# Patient Record
Sex: Male | Born: 1950 | Hispanic: No | Marital: Married | Smoking: Never smoker
Health system: Southern US, Community
[De-identification: ages and names within clinical notes are randomized; demographics above are authoritative.]

## PROBLEM LIST (undated history)

## (undated) DIAGNOSIS — E119 Type 2 diabetes mellitus without complications: Secondary | ICD-10-CM

## (undated) DIAGNOSIS — S83209A Unspecified tear of unspecified meniscus, current injury, unspecified knee, initial encounter: Secondary | ICD-10-CM

## (undated) DIAGNOSIS — E785 Hyperlipidemia, unspecified: Secondary | ICD-10-CM

## (undated) DIAGNOSIS — H269 Unspecified cataract: Secondary | ICD-10-CM

## (undated) DIAGNOSIS — I10 Essential (primary) hypertension: Secondary | ICD-10-CM

## (undated) HISTORY — DX: Essential (primary) hypertension: I10

## (undated) HISTORY — DX: Hyperlipidemia, unspecified: E78.5

## (undated) HISTORY — PX: MOUTH SURGERY: SHX715

## (undated) HISTORY — PX: CATARACT EXTRACTION, BILATERAL: SHX1313

## (undated) HISTORY — PX: COLONOSCOPY W/ POLYPECTOMY: SHX1380

## (undated) HISTORY — DX: Type 2 diabetes mellitus without complications: E11.9

---

## 2003-02-24 ENCOUNTER — Encounter: Payer: Self-pay | Admitting: Internal Medicine

## 2005-01-12 ENCOUNTER — Ambulatory Visit: Payer: Self-pay | Admitting: Internal Medicine

## 2006-05-22 ENCOUNTER — Ambulatory Visit: Payer: Self-pay | Admitting: Internal Medicine

## 2006-05-30 ENCOUNTER — Ambulatory Visit: Payer: Self-pay | Admitting: Internal Medicine

## 2007-09-26 ENCOUNTER — Ambulatory Visit: Payer: Self-pay | Admitting: Internal Medicine

## 2007-09-26 DIAGNOSIS — E785 Hyperlipidemia, unspecified: Secondary | ICD-10-CM

## 2007-09-26 DIAGNOSIS — E782 Mixed hyperlipidemia: Secondary | ICD-10-CM | POA: Insufficient documentation

## 2007-09-26 LAB — CONVERTED CEMR LAB: Cholesterol, target level: 200 mg/dL

## 2008-02-27 ENCOUNTER — Ambulatory Visit: Payer: Self-pay | Admitting: Internal Medicine

## 2008-02-27 DIAGNOSIS — M545 Low back pain: Secondary | ICD-10-CM

## 2008-02-27 DIAGNOSIS — N400 Enlarged prostate without lower urinary tract symptoms: Secondary | ICD-10-CM

## 2008-02-27 DIAGNOSIS — I10 Essential (primary) hypertension: Secondary | ICD-10-CM | POA: Insufficient documentation

## 2008-02-27 DIAGNOSIS — Z86010 Personal history of colonic polyps: Secondary | ICD-10-CM

## 2008-02-28 ENCOUNTER — Encounter: Payer: Self-pay | Admitting: Internal Medicine

## 2008-03-03 ENCOUNTER — Ambulatory Visit: Payer: Self-pay | Admitting: Internal Medicine

## 2008-03-07 LAB — CONVERTED CEMR LAB
ALT: 25 units/L (ref 0–53)
Alkaline Phosphatase: 63 units/L (ref 39–117)
Basophils Absolute: 0 10*3/uL (ref 0.0–0.1)
Bilirubin, Direct: 0.1 mg/dL (ref 0.0–0.3)
CO2: 30 meq/L (ref 19–32)
Calcium: 9.7 mg/dL (ref 8.4–10.5)
Chloride: 106 meq/L (ref 96–112)
Cholesterol: 172 mg/dL (ref 0–200)
HDL: 37.3 mg/dL — ABNORMAL LOW (ref >39.0)
Hgb A1c MFr Bld: 6.6 % — ABNORMAL HIGH (ref 4.6–6.0)
LDL Cholesterol: 120 mg/dL — ABNORMAL HIGH (ref 0–99)
Lymphocytes Relative: 23 % (ref 12.0–46.0)
MCHC: 34.1 g/dL (ref 30.0–36.0)
Microalb, Ur: 2.2 mg/dL — ABNORMAL HIGH (ref 0.0–1.9)
Monocytes Relative: 14.6 % — ABNORMAL HIGH (ref 3.0–12.0)
Neutro Abs: 3.5 10*3/uL (ref 1.4–7.7)
Neutrophils Relative %: 60 % (ref 43.0–77.0)
Platelets: 158 10*3/uL (ref 150–400)
Potassium: 4.2 meq/L (ref 3.5–5.1)
RDW: 12.4 % (ref 11.5–14.6)
Saturation Ratios: 26.7 % (ref 20.0–50.0)
Sodium: 140 meq/L (ref 135–145)
TSH: 1.24 microintl units/mL (ref 0.35–5.50)
Total Bilirubin: 1.3 mg/dL — ABNORMAL HIGH (ref 0.3–1.2)
Total CHOL/HDL Ratio: 4.6
Transferrin: 225 mg/dL (ref >=212.0)
Triglycerides: 73 mg/dL (ref 0–149)
VLDL: 15 mg/dL (ref 0–40)

## 2008-03-09 ENCOUNTER — Encounter (INDEPENDENT_AMBULATORY_CARE_PROVIDER_SITE_OTHER): Payer: Self-pay | Admitting: *Deleted

## 2008-03-09 ENCOUNTER — Telehealth (INDEPENDENT_AMBULATORY_CARE_PROVIDER_SITE_OTHER): Payer: Self-pay | Admitting: *Deleted

## 2008-03-10 ENCOUNTER — Ambulatory Visit: Payer: Self-pay | Admitting: Internal Medicine

## 2008-03-12 ENCOUNTER — Encounter: Payer: Self-pay | Admitting: Internal Medicine

## 2008-03-12 ENCOUNTER — Ambulatory Visit: Payer: Self-pay

## 2008-03-26 ENCOUNTER — Telehealth (INDEPENDENT_AMBULATORY_CARE_PROVIDER_SITE_OTHER): Payer: Self-pay | Admitting: *Deleted

## 2008-04-16 ENCOUNTER — Telehealth (INDEPENDENT_AMBULATORY_CARE_PROVIDER_SITE_OTHER): Payer: Self-pay | Admitting: *Deleted

## 2008-04-26 ENCOUNTER — Telehealth (INDEPENDENT_AMBULATORY_CARE_PROVIDER_SITE_OTHER): Payer: Self-pay | Admitting: *Deleted

## 2008-06-08 ENCOUNTER — Ambulatory Visit: Payer: Self-pay | Admitting: Internal Medicine

## 2008-06-12 LAB — CONVERTED CEMR LAB
ALT: 17 units/L (ref 0–53)
AST: 22 units/L (ref 0–37)
Bilirubin, Direct: 0.1 mg/dL (ref 0.0–0.3)
Cholesterol: 165 mg/dL (ref 0–200)
Creatinine,U: 227.9 mg/dL
LDL Cholesterol: 113 mg/dL — ABNORMAL HIGH (ref 0–99)
Microalb, Ur: 0.7 mg/dL (ref 0.0–1.9)
Total Bilirubin: 1.4 mg/dL — ABNORMAL HIGH (ref 0.3–1.2)
Total CHOL/HDL Ratio: 5

## 2008-06-15 ENCOUNTER — Ambulatory Visit: Payer: Self-pay | Admitting: Internal Medicine

## 2008-06-15 ENCOUNTER — Encounter (INDEPENDENT_AMBULATORY_CARE_PROVIDER_SITE_OTHER): Payer: Self-pay | Admitting: *Deleted

## 2008-06-15 ENCOUNTER — Telehealth (INDEPENDENT_AMBULATORY_CARE_PROVIDER_SITE_OTHER): Payer: Self-pay | Admitting: *Deleted

## 2008-09-23 ENCOUNTER — Ambulatory Visit: Payer: Self-pay | Admitting: Internal Medicine

## 2008-09-23 DIAGNOSIS — R7309 Other abnormal glucose: Secondary | ICD-10-CM

## 2008-09-25 LAB — CONVERTED CEMR LAB
ALT: 17 units/L (ref 0–53)
AST: 23 units/L (ref 0–37)
Albumin: 4.3 g/dL (ref 3.5–5.2)
Alkaline Phosphatase: 64 units/L (ref 39–117)
Hgb A1c MFr Bld: 5.8 % (ref 4.6–6.5)
Total Bilirubin: 1.1 mg/dL (ref 0.3–1.2)
Total CHOL/HDL Ratio: 4
Total Protein: 7.3 g/dL (ref 6.0–8.3)

## 2009-07-25 ENCOUNTER — Telehealth (INDEPENDENT_AMBULATORY_CARE_PROVIDER_SITE_OTHER): Payer: Self-pay | Admitting: *Deleted

## 2009-09-08 ENCOUNTER — Ambulatory Visit: Payer: Self-pay | Admitting: Internal Medicine

## 2009-09-08 DIAGNOSIS — R209 Unspecified disturbances of skin sensation: Secondary | ICD-10-CM | POA: Insufficient documentation

## 2009-09-08 DIAGNOSIS — N402 Nodular prostate without lower urinary tract symptoms: Secondary | ICD-10-CM

## 2009-12-01 ENCOUNTER — Telehealth (INDEPENDENT_AMBULATORY_CARE_PROVIDER_SITE_OTHER): Payer: Self-pay | Admitting: *Deleted

## 2009-12-02 ENCOUNTER — Ambulatory Visit: Payer: Self-pay | Admitting: Internal Medicine

## 2009-12-02 DIAGNOSIS — S20219A Contusion of unspecified front wall of thorax, initial encounter: Secondary | ICD-10-CM

## 2009-12-20 ENCOUNTER — Ambulatory Visit: Payer: Self-pay | Admitting: Internal Medicine

## 2009-12-20 DIAGNOSIS — M546 Pain in thoracic spine: Secondary | ICD-10-CM | POA: Insufficient documentation

## 2009-12-20 DIAGNOSIS — K6289 Other specified diseases of anus and rectum: Secondary | ICD-10-CM | POA: Insufficient documentation

## 2009-12-21 ENCOUNTER — Telehealth (INDEPENDENT_AMBULATORY_CARE_PROVIDER_SITE_OTHER): Payer: Self-pay | Admitting: *Deleted

## 2009-12-21 ENCOUNTER — Encounter: Payer: Self-pay | Admitting: Internal Medicine

## 2009-12-22 LAB — CONVERTED CEMR LAB
Basophils Absolute: 0 10*3/uL (ref 0.0–0.1)
Eosinophils Absolute: 0.2 10*3/uL (ref 0.0–0.7)
Lymphocytes Relative: 27.3 % (ref 12.0–46.0)
MCHC: 33.6 g/dL (ref 30.0–36.0)
MCV: 94.5 fL (ref 78.0–100.0)
Monocytes Absolute: 0.8 10*3/uL (ref 0.1–1.0)
Neutrophils Relative %: 51.8 % (ref 43.0–77.0)
Platelets: 152 10*3/uL (ref 150.0–400.0)

## 2010-03-12 LAB — CONVERTED CEMR LAB
ALT: 27 units/L (ref 0–53)
AST: 31 units/L (ref 0–37)
Albumin: 4.5 g/dL (ref 3.5–5.2)
BUN: 15 mg/dL (ref 6–23)
Basophils Absolute: 0 10*3/uL (ref 0.0–0.1)
Chloride: 97 meq/L (ref 96–112)
Cholesterol: 150 mg/dL (ref 0–200)
Eosinophils Relative: 1.9 % (ref 0.0–5.0)
GFR calc non Af Amer: 72.86 mL/min (ref >60)
Glucose, Bld: 83 mg/dL (ref 70–99)
HCT: 44.5 % (ref 39.0–52.0)
Hemoglobin: 15.3 g/dL (ref 13.0–17.0)
LDL Goal: 120 mg/dL
Lymphs Abs: 1.3 10*3/uL (ref 0.7–4.0)
MCV: 92.4 fL (ref 78.0–100.0)
Monocytes Absolute: 0.6 10*3/uL (ref 0.1–1.0)
Monocytes Relative: 11.9 % (ref 3.0–12.0)
Neutro Abs: 2.7 10*3/uL (ref 1.4–7.7)
PSA: 1.01 ng/mL (ref 0.10–4.00)
Platelets: 141 10*3/uL — ABNORMAL LOW (ref 150.0–400.0)
Potassium: 4.2 meq/L (ref 3.5–5.1)
RDW: 13.6 % (ref 11.5–14.6)
Sodium: 139 meq/L (ref 135–145)
TSH: 1.11 microintl units/mL (ref 0.35–5.50)
Total Bilirubin: 1.1 mg/dL (ref 0.3–1.2)

## 2010-03-14 NOTE — Consult Note (Signed)
Summary: [facility]  [facility]   Imported By: Lanelle Bal 01/03/2010 09:25:13  _____________________________________________________________________  External Attachment:    Type:   Image     Comment:   External Document
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
## 2010-03-14 NOTE — Progress Notes (Signed)
Summary: Refill Request  Phone Note Refill Request Call back at [phone] Message from:  Pharmacy on July 25, 2009 2:08 PM  Refills Requested: Medication #1:  BENAZEPRIL HCL 40 MG  TABS (BENAZEPRIL HCL) 1 once daily*OFFICE VISIT DUE NOW *   Dosage confirmed as above?Dosage Confirmed   Supply Requested: 3 months [facility] [address] [place], [place] [address]   Method Requested:   Next Appointment Scheduled: none Initial call taken by: Harold Barban,  July 25, 2009 2:10 PM  Follow-up for Phone Call        RX faxed to: [phone] Follow-up by: Shonna Chock,  July 25, 2009 3:21 PM    New/Updated Medications: BENAZEPRIL HCL 40 MG TABS (BENAZEPRIL HCL) 1 by mouth once daily Prescriptions: BENAZEPRIL HCL 40 MG TABS (BENAZEPRIL HCL) 1 by mouth once daily  #90 x 0   Entered by:   Shonna Chock   Authorized by:   Marga Melnick MD   Signed by:   Shonna Chock on 07/25/2009   Method used:   Print then Give to Patient   RxID:   [id number]

## 2010-03-14 NOTE — Assessment & Plan Note (Signed)
Summary: cpx/fasting//kn   Vital Signs:  Patient profile:   60 year old male Height:      68.75 inches Weight:      203.2 pounds BMI:     30.34 Temp:     98.3 degrees F oral Pulse rate:   72 / minute Resp:     14 per minute BP sitting:   122 / 80  (left arm) Cuff size:   large  Vitals Entered By: Shonna Chock CMA (September 08, 2009 10:54 AM)    History of Present Illness: Mr. Blanchette is here for a physical; he describes numbness & tingling in LUE  from shoulder to fingers with exercise intermittently. Hyperlipidemia Follow-Up      This is a 60 year old man who presents for Hyperlipidemia follow-up.  The patient denies muscle aches, GI upset, abdominal pain, flushing, itching, constipation, diarrhea, and fatigue.  The patient denies the following symptoms: chest pain/pressure, exercise intolerance, dypsnea, palpitations, syncope, and pedal edema.  Compliance with medications (by patient report) has been near 100%.  Dietary compliance has been fair.  The patient reports exercising daily.   Hypertension Follow-Up      The patient also presents for Hypertension follow-up.  The patient denies lightheadedness, urinary frequency, and headaches.  Compliance with medications (by patient report) has been near 100%.  BP @home  140/93 on average.  Lipid Management History:      Positive NCEP/ATP III risk factors include male age 60 years old or older, HDL cholesterol less than 40, family history for ischemic heart disease (females less than 68 years old), and hypertension.  Negative NCEP/ATP III risk factors include non-diabetic, non-tobacco-user status, no ASHD (atherosclerotic heart disease), no prior stroke/TIA, no peripheral vascular disease, and no history of aortic aneurysm.     Allergies (verified): No Known Drug Allergies  Past History:  Past Medical History: Hyperglycemia, fasting (glucose 108 in 2006) Hyperlipidemia: NMR 2005: LDL 188(2219/1166), HDL 33, TG 116. LDL goal = < 120.  [place] Study LDL goal = < 130. Hypertension Colonic polyps, hx of  Past Surgical History: Colonoscopy: no polyps 2003;  PMH of polyps 1996 in [place]  Family History: Father: colon cancer, lung cancer, prostatecancer Mother: HTN, DM,dialysis, CAD with MI in 41s, CVA Siblings:bro :? colon cancer, HTN; bro : d lung  cancer ; M uncle :colon cancer; P uncles: lung  cancer  Social History: Occupation: Scientist, clinical (histocompatibility and immunogenetics) Married Never Smoked Alcohol use-no Regular exercise-yes: walking 2 mpd . Note : he is moving to [place] ,Wynelle Link 09/11/2009.  Review of Systems General:  Denies chills, fever, sleep disorder, and sweats. Eyes:  Denies blurring, double vision, and vision loss-both eyes. ENT:  Denies difficulty swallowing and hoarseness. Resp:  Denies cough and sputum productive. GI:  Denies bloody stools, change in bowel habits, dark tarry stools, and indigestion. GU:  Denies discharge, dysuria, and hematuria. MS:  Denies joint pain, joint redness, joint swelling, low back pain, mid back pain, and thoracic pain. Derm:  Denies changes in nail beds, dryness, hair loss, and lesion(s). Neuro:  Denies disturbances in coordination, numbness, and weakness. Psych:  Denies anxiety and depression. Endo:  Denies cold intolerance, excessive hunger, excessive thirst, excessive urination, and heat intolerance. Heme:  Denies abnormal bruising and bleeding. Allergy:  Complains of itching eyes, seasonal allergies, and sneezing; OTC meds as needed . No angioedema..  Physical Exam  General:  well-nourished; alert,appropriate and cooperative throughout examination Head:  Normocephalic and atraumatic without obvious abnormalities. No apparent alopecia  Eyes:  No corneal or conjunctival inflammation noted. Perrla. Funduscopic exam benign, without hemorrhages, exudates or papilledema. Ears:  External ear exam shows no significant lesions or deformities.  Otoscopic examination reveals clear canals,  tympanic membranes are intact bilaterally without bulging, retraction, inflammation or discharge. Hearing is grossly normal bilaterally. Nose:  External nasal examination shows no deformity or inflammation. Nasal mucosa are pink and moist without lesions or exudates. Mouth:  Oral mucosa and oropharynx without lesions or exudates.  Teeth in good repair. Neck:  No deformities, masses, or tenderness noted. Full ROM Lungs:  Normal respiratory effort, chest expands symmetrically. Lungs are clear to auscultation, no crackles or wheezes. Heart:  Normal rate and regular rhythm. S1 and S2 normal without gallop, murmur, click, rub. Abdomen:  Bowel sounds positive,abdomen soft and non-tender without masses, organomegaly or hernias noted. Rectal:  No external abnormalities noted. Normal sphincter tone. No rectal masses or tenderness. Genitalia:  Testes bilaterally descended without nodularity, tenderness or masses. No scrotal masses or lesions. No penis lesions or urethral discharge. Prostate:  Prostate small and smooth, ? small soft nodule L inferior lobe. No , tenderness, mass, asymmetry or induration. Msk:  No deformity or scoliosis noted of thoracic or lumbar spine.   Pulses:  R and L carotid,radial and posterior tibial pulses are full and equal bilaterally. Decreased DPP w/o ischemic change Extremities:  No clubbing, cyanosis, edema, or deformity noted with normal full range of motion of all joints.   Neurologic:  alert & oriented X3, strength normal in all extremities, and gait normal.   Skin:  Intact without suspicious lesions or rashes Cervical Nodes:  No lymphadenopathy noted Axillary Nodes:  No palpable lymphadenopathy Inguinal Nodes:  No significant adenopathy Psych:  memory intact for recent and remote, normally interactive, and good eye contact.     Impression & Recommendations:  Problem # 1:  ROUTINE GENERAL MEDICAL EXAM@HEALTH  CARE FACL (ICD-V70.0)  Orders: EKG w/ Interpretation  (93000) Venipuncture (41324) TLB-Lipid Panel (80061-LIPID) TLB-BMP (Basic Metabolic Panel-BMET) (80048-METABOL) TLB-CBC Platelet - w/Differential (85025-CBCD) TLB-Hepatic/Liver Function Pnl (80076-HEPATIC) TLB-TSH (Thyroid Stimulating Hormone) (84443-TSH) TLB-PSA (Prostate Specific Antigen) (84153-PSA) TLB-A1C / Hgb A1C (Glycohemoglobin) (83036-A1C) Specimen Handling (40102)  Problem # 2:  PARESTHESIA (ICD-782.0)  Intermittent exertional N&T LUE; NOT positional   Orders: Venipuncture (72536)  Problem # 3:  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 control; home BP cuff needs to be evaluated His updated medication list for this problem includes:    Benazepril Hcl 40 Mg Tabs (Benazepril hcl) .Marland Kitchen... 1 by mouth once daily    Amlodipine Besylate 5 Mg Tabs (Amlodipine besylate) .Marland Kitchen... Add 1 once daily to benazepril 40 mg once daily if bp averages > 135/85  Orders: Venipuncture (64403)  Problem # 5:  HYPERLIPIDEMIA (ICD-272.4)  Orders: Venipuncture (47425)  Problem # 6:  FASTING HYPERGLYCEMIA (ICD-790.29)  Orders: TLB-A1C / Hgb A1C (Glycohemoglobin) (83036-A1C)  Complete Medication List: 1)  Benazepril Hcl 40 Mg Tabs (Benazepril hcl) .Marland Kitchen.. 1 by mouth once daily 2)  Pravastatin Sodium 40 Mg Tabs (pravastatin Sodium)  .Marland Kitchen.. 1 at bedtime 3)  Onetouch Ultrasoft Lancets Misc (Lancets) .... M,w,f,sun check fasting bloodsugar. t,th,sat 2 hours after breakfast or dinner 4)  Onetouch Test Strp (Glucose blood) .... As directed 5)  Amlodipine Besylate 5 Mg Tabs (Amlodipine besylate) .... Add 1 once daily to benazepril 40 mg once daily if bp averages > 135/85  Other Orders: Tdap => 62yrs IM (16109) Admin 1st Vaccine (60454)  Lipid Assessment/Plan:      Based on NCEP/ATP III, the patient's risk factor category is "0-1 risk  factors".  The patient's lipid goals are as follows: Total cholesterol goal is 200; LDL cholesterol goal is 120; HDL cholesterol goal is 40; Triglyceride goal is 150.  His LDL cholesterol goal has been met.    Patient Instructions: 1)  Curtail exercise until you can schedule a routine stress test.Check your BP cuff @ drugstore & take it to all MD appts. Possible prostate nodule should be rechecked in 8 weeks. PSA ordered. Prescriptions: AMLODIPINE BESYLATE 5 MG TABS (AMLODIPINE BESYLATE) add 1 once daily to Benazepril 40 mg once daily if BP AVERAGES > 135/85  #30 x 5   Entered and Authorized by:   Marga Melnick MD   Signed by:   Marga Melnick MD on 09/08/2009   Method used:   Print then Give to Patient   RxID:   [id number] PRAVASTATIN SODIUM 40 MG TABS (PRAVASTATIN SODIUM) 1 at bedtime  #90 x 3   Entered and Authorized by:   Marga Melnick MD   Signed by:   Marga Melnick MD on 09/08/2009   Method used:   Print then Give to Patient   RxID:   [id number] BENAZEPRIL HCL 40 MG TABS (BENAZEPRIL HCL) 1 by mouth once daily  #90 x 3   Entered and Authorized by:   Marga Melnick MD   Signed by:   Marga Melnick MD on 09/08/2009   Method used:   Print then Give to Patient   RxID:   [id number]    Immunizations Administered:  Tetanus Vaccine:    Vaccine Type: Tdap    Site: right deltoid    Mfr: GlaxoSmithKline    Dose: 0.5 ml    Route: IM    Given by: Shonna Chock CMA    Exp. Date: 05/07/2011    Lot #: [id number]    VIS given: 12/31/06 version given September 08, 2009.
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
## 2010-03-14 NOTE — Progress Notes (Signed)
  Phone Note Other Incoming   Request: Send information Summary of Call: Patient called and requested a disc for his appt with Dr. Ethelene Hal today. Disc is ready for pick up at Medical records reception.

## 2010-06-30 NOTE — Assessment & Plan Note (Signed)
Bryce Hurst                        Bryce Hurst NOTE   NAME:Hurst, Bryce COUTTS                    MRN:          [id number]  DATE:05/22/2006                            DOB:          1950-02-20    Bryce Hurst was seen May 22, 2006 at age 60 for a comprehensive physical  exam.   His only active symptom was pain at the right elbow which was classic  for tenosynovitis.   PAST HISTORY:  Includes a colonoscopy in 1996 which revealed polyps in  the colon.  Repeat colonoscopy in 2003 was negative.  He also has had  bone implant surgery.   Review of the record indicates that he has had mild dyslipidemia; his  LDL has been as high as 188 in 2005.  Additionally he has had mild  hyperglycemia; glucose was 108 in 2006.  At that time  his LDL was 118.  He previously has been a vegetarian but is not at present.   He swims at least 3 times per week and does cardio-exercises with no  cardiopulmonary symptoms.   FAMILY HISTORY:  Included cancer of the lung and colon in his father,  who was a smoker.  His father also had coronary artery disease.  Paternal aunt had breast cancer, paternal uncle had lung cancer and was  a smoker.  Another paternal uncle had prostate cancer.  His mother had  diabetes, hypertension, 4 heart attacks with onset in her 56s and  stroke.  A brother had hypertension and a brother died of lung cancer.   Bryce Hurst has never smoked and does not drink.   He is on no medications at present and has no known drug allergies.   As stated the remainder of the review of systems was negative.   He is 5 feet 8 inches and weighs 206 pounds fully clothed, which is a  gain of approximately 6 pounds.  Pulse was 76 and regular.  Respiratory  rate 14.  Blood pressure 120/84.  He did have evidence of mild conjunctivitis in the right eye with  erythema but no purulence , chemosis, or abnormality of EOM or vision.  On fundal exam there was  slight arteriolar narrowing.  Some slight  increased cerumen in noted canals.  The cardiopulmonary exam was unremarkable.  The dorsalis pedis pulses were slightly decreased, but there was no  evidence of ischemia peripherally.  He had no organomegaly, lymphadenopathy.  MUSCULOSKELETAL:  Revealed classic tenosynovitis of the right elbow.  Prostate was slightly irregular without definite nodularity.  Hemoccult  testing was negative.   EKG was normal; there was poor R wave progression across the precordium.   In reviewing the NMR LipoProfile, his LDL goal should be less than 115.  Because of his mild hyperglycemia an A1C would be indicated.  Carbohydrate restriction such as the cookbook The Flat Belly Diet  would be recommended.  Low weight, stretching  exercises for the tendons  @ the elbow were recommended, followed by the application of moist heart  or Zostrix cream.  Erythromycin ointment twice a day for 5 days was  recommended  for the mild conjunctivitis.  He did not have cervical  lymphadenopathy and there was no evidence of frank purulence.  Warning  signs & symptoms of decreased extraocular motion, ocular pain, high  fevers or purulent discharge were to be reported.   Further recommendations are pending the completion of fasting labs.     Bryce Hurst. Bryce Ren, MD,FACP,FCCP  Electronically Signed    [facility]  DD: 05/30/2006  DT: 05/30/2006  Job #: [id number]   cc:   Bryce Hurst

## 2011-02-19 ENCOUNTER — Telehealth: Payer: Self-pay | Admitting: Internal Medicine

## 2011-02-19 MED ORDER — BENAZEPRIL HCL 40 MG PO TABS: 40.0000 mg | ORAL_TABLET | Freq: Every day | ORAL | Status: DC

## 2011-02-19 NOTE — Telephone Encounter (Signed)
Patient called pharmacy to refill blood pressure med(patient unsure of name) and was told prescription had expired and to call our office. Patient stated that he is out of medication and checked his blood pressure this morning. 159/104. [facility] on [address]

## 2011-02-19 NOTE — Telephone Encounter (Signed)
RX sent to pharmacy for a 90 day supply.   Spoke with patient, Patient schedule for f/u on Thursday, patient was offered to be seen sooner and refused

## 2011-02-22 ENCOUNTER — Ambulatory Visit (INDEPENDENT_AMBULATORY_CARE_PROVIDER_SITE_OTHER): Payer: PRIVATE HEALTH INSURANCE | Admitting: Internal Medicine

## 2011-02-22 ENCOUNTER — Encounter: Payer: Self-pay | Admitting: Internal Medicine

## 2011-02-22 DIAGNOSIS — J019 Acute sinusitis, unspecified: Secondary | ICD-10-CM

## 2011-02-22 DIAGNOSIS — I10 Essential (primary) hypertension: Secondary | ICD-10-CM

## 2011-02-22 DIAGNOSIS — J209 Acute bronchitis, unspecified: Secondary | ICD-10-CM

## 2011-02-22 MED ORDER — AMOXICILLIN 500 MG PO CAPS: 500.0000 mg | ORAL_CAPSULE | Freq: Three times a day (TID) | ORAL | Status: AC

## 2011-02-22 NOTE — Assessment & Plan Note (Signed)
Blood pressure is well-controlled since she's been back on his medications. There was inadvertent disruption in his therapy.  He has a cough which is essentially dry for the most part. If this persists after treating the bronchitis and rhinosinusitis, the ACE inhibitor should be changed to an ARB.

## 2011-02-22 NOTE — Progress Notes (Signed)
  Subjective:    Patient ID: Bryce Hurst, male    DOB: August 18, 1950, 61 y.o.   MRN: [id number]  HPI  #1 HYPERTENSION: Disease Monitoring  Blood pressure range: up to 159/104 over holidays. He had run out of his medication and felt that his increased stress from all family coming home for the holidays. Additionally he was taking NyQuil for respiratory symptoms. The acute  blood pressure elevation was associated with some headache over one  week  Chest pain: no   Dyspnea: no   Claudication: no   Medication compliance: he had misplaced his medications; when he tried to refill them they were expired.  Medication Side Effects  Lightheadedness: no   Urinary frequency: no   Edema: no     Preventitive Healthcare:  Exercise: yes, swimming   Diet Pattern: no plan  Salt Restriction:no    #2 Respiratory tract infection Onset/symptoms:12/26 as body aches; he has not had the flu shot Exposures (illness/environmental/extrinsic):no Progression of symptoms:to chills & head congestion & cough Treatments/response:Nyquil Present symptoms: Fever/chills/sweats:no Frontal headache:no Facial pain:no Nasal purulence:yellow Sore throat:yes Dental pain:no Lymphadenopathy:no Wheezing/shortness of breath:no Cough/sputum/hemoptysis:dry cough; intermittent scant  yellow Associated extrinsic/allergic symptoms:itchy eyes/ sneezing:no Past medical history: Seasonal allergies: yes/asthma:no Smoking history:never             Review of Systems     Objective:   Physical Exam  General appearance:good health ;well nourished; no acute distress or increased work of breathing is present.  No  lymphadenopathy about the head, neck, or axilla noted.   Eyes: No conjunctival inflammation or lid edema is present.   Ears:  External ear exam shows no significant lesions or deformities.  Otoscopic examination reveals clear canals, tympanic membranes are intact bilaterally without bulging, retraction,  inflammation or discharge.  Nose:  External nasal examination shows no deformity or inflammation. Nasal mucosa are pink and moist without lesions or exudates. No septal dislocation or deviation.No obstruction to airflow.   Oral exam: Dental hygiene is good; lips and gums are healthy appearing.There is no oropharyngeal erythema or exudate noted.      Heart:  Normal rate and regular rhythm. S1 and S2 normal without gallop, murmur, click, rub or other extra sounds.   Lungs:Chest clear to auscultation; no wheezes, rhonchi,rales ,or rubs present.No increased work of breathing.    Extremities:  No cyanosis, edema, or clubbing  noted    All pulses intact without  bruits .No ischemic skin changes.     Skin: Warm & dry w/o jaundice or tenting.           Assessment & Plan:  #1 rhinosinusitis with purulent secretions  #2 bronchitis, acute. These are in the context of probable post viral influenza syndrome.  Plan: See orders and recommendations.

## 2011-02-22 NOTE — Patient Instructions (Signed)
Plain Mucinex for thick secretions ;force NON dairy fluids. Use a Neti pot daily as needed for sinus congestion  Blood Pressure Goal  Ideally is an AVERAGE < 135/85. This AVERAGE should be calculated from @ least 5-7 BP readings taken @ different times of day on different days of week. You should not respond to isolated BP readings , but rather the AVERAGE for that week

## 2011-04-03 ENCOUNTER — Encounter: Payer: Self-pay | Admitting: Internal Medicine

## 2011-04-03 ENCOUNTER — Ambulatory Visit (INDEPENDENT_AMBULATORY_CARE_PROVIDER_SITE_OTHER): Payer: PRIVATE HEALTH INSURANCE | Admitting: Internal Medicine

## 2011-04-03 VITALS — BP 136/80 | HR 85 | Temp 97.8°F | Resp 14 | Ht 69.0 in | Wt 209.0 lb

## 2011-04-03 DIAGNOSIS — N400 Enlarged prostate without lower urinary tract symptoms: Secondary | ICD-10-CM

## 2011-04-03 DIAGNOSIS — E785 Hyperlipidemia, unspecified: Secondary | ICD-10-CM

## 2011-04-03 DIAGNOSIS — Z0000 Encounter for general adult medical examination without abnormal findings: Secondary | ICD-10-CM

## 2011-04-03 LAB — CBC WITH DIFFERENTIAL/PLATELET
Basophils Relative: 0.5 % (ref 0.0–3.0)
Eosinophils Absolute: 0.6 10*3/uL (ref 0.0–0.7)
Eosinophils Relative: 9.9 % — ABNORMAL HIGH (ref 0.0–5.0)
Lymphocytes Relative: 21.5 % (ref 12.0–46.0)
MCV: 92.7 fl (ref 78.0–100.0)
Monocytes Absolute: 0.8 10*3/uL (ref 0.1–1.0)
Neutrophils Relative %: 56.1 % (ref 43.0–77.0)
Platelets: 147 10*3/uL — ABNORMAL LOW (ref 150.0–400.0)
RBC: 5.07 Mil/uL (ref 4.22–5.81)
WBC: 6.4 10*3/uL (ref 4.5–10.5)

## 2011-04-03 LAB — BASIC METABOLIC PANEL
CO2: 28 mEq/L (ref 19–32)
Calcium: 9.8 mg/dL (ref 8.4–10.5)
Creatinine, Ser: 1 mg/dL (ref 0.4–1.5)
GFR: 82.8 mL/min (ref >60.00)
Glucose, Bld: 121 mg/dL — ABNORMAL HIGH (ref 70–99)
Sodium: 139 mEq/L (ref 135–145)

## 2011-04-03 LAB — HEPATIC FUNCTION PANEL
ALT: 24 U/L (ref 0–53)
Alkaline Phosphatase: 71 U/L (ref 39–117)
Bilirubin, Direct: 0.1 mg/dL (ref 0.0–0.3)
Total Bilirubin: 0.7 mg/dL (ref 0.3–1.2)
Total Protein: 7.1 g/dL (ref 6.0–8.3)

## 2011-04-03 LAB — TSH: TSH: 1.1 u[IU]/mL (ref 0.35–5.50)

## 2011-04-03 LAB — LIPID PANEL
HDL: 43 mg/dL (ref >39.00)
Total CHOL/HDL Ratio: 5
Triglycerides: 273 mg/dL — ABNORMAL HIGH (ref 0.0–149.0)

## 2011-04-03 LAB — PSA: PSA: 0.81 ng/mL (ref 0.10–4.00)

## 2011-04-03 NOTE — Patient Instructions (Signed)
Preventive Health Care: Exercise at least 30-45 minutes a day,  3-4 days a week.  Eat a low-fat diet with lots of fruits and vegetables, up to 7-9 servings per day. Consume less than 40 grams of sugar per day from foods & drinks with High Fructose Corn Sugar as # 1,2,3 or # 4 on label. Health Care Power of Attorney & Living Will. Complete if not in place ; these place you in charge of your health care decisions. Blood Pressure Goal  Ideally is an AVERAGE < 135/85. This AVERAGE should be calculated from @ least 5-7 BP readings taken @ different times of day on different days of week. You should not respond to isolated BP readings , but rather the AVERAGE for that week  

## 2011-04-03 NOTE — Assessment & Plan Note (Signed)
As noted there is a soft lobular change of the left inferior lobe of the prostate without nodularity or induration.

## 2011-04-03 NOTE — Progress Notes (Signed)
  Subjective:    Patient ID: Bryce Hurst, male    DOB: 1950-10-24, 61 y.o.   MRN: [id number]  HPI  Bryce Hurst is here for a physical;he denies acute issues.     Review of Systems HYPERTENSION: Disease Monitoring: Blood pressure range-not checked  Chest pain, palpitations- no       Dyspnea- no Medications: Compliance- yes  Lightheadedness,Syncope- no   Edema- no  FASTING HYPERGLYCEMIA; PMH of: Polyuria/phagia/dipsia- no      Visual problems- no  HYPERLIPIDEMIA: Disease Monitoring: See symptoms for Hypertension Medications: Compliance- formerly on statin ; unsure why it was stopped   Abd pain, bowel changes-no   Muscle aches- no        Objective:   Physical Exam Gen.: Healthy and well-nourished in appearance. Alert, appropriate and cooperative throughout exam. Head: Normocephalic without obvious abnormalities;  no alopecia  Eyes: No corneal or conjunctival inflammation noted. Pupils equal round reactive to light and accommodation. Fundal exam is benign without hemorrhages, exudate, papilledema. Extraocular motion intact. Vision grossly normal with lenses.Cataract OD Ears: External  ear exam reveals no significant lesions or deformities. Canals clear .TMs normal. Hearing is grossly normal bilaterally. Nose: External nasal exam reveals no deformity or inflammation. Nasal mucosa are pink and moist. No lesions or exudates noted.  Mouth: Oral mucosa and oropharynx reveal no lesions or exudates. Teeth in good repair. Neck: No deformities, masses, or tenderness noted. Range of motion & Thyroid normal (physiologic asymmtery ) Lungs: Normal respiratory effort; chest expands symmetrically. Lungs are clear to auscultation without rales, wheezes, or increased work of breathing. Heart: Normal rate and rhythm. Normal S1 and S2. No gallop, click, or rub. S4 w/o  murmur. Abdomen: Bowel sounds normal; abdomen soft and nontender. No masses, organomegaly or hernias noted. Genitalia/DRE:  Genital exam  was unremarkable. Prostate is not enlarged but there is mild asymmetry. There is a soft lodular change of the left inferior lobe. There is no induration                                          Musculoskeletal/extremities: No deformity or scoliosis noted of  the thoracic or lumbar spine. No clubbing, cyanosis, edema, or deformity noted. Range of motion  normal .Tone & strength  normal.Joints normal. Nail health  good. Vascular: Carotid, radial artery, dorsalis pedis and  posterior tibial pulses are full and equal. No bruits present. Neurologic: Alert and oriented x3. Deep tendon reflexes symmetrical and normal.          Skin: Intact without suspicious lesions or rashes. Lipoma in the epigastric area Lymph: No cervical, axillary, or inguinal lymphadenopathy present. Psych: Mood and affect are normal. Normally interactive                                                                                         Assessment & Plan:  #1 comprehensive physical exam; no acute findings #2 see Problem List with Assessments & Recommendations Plan: see Orders

## 2011-05-21 ENCOUNTER — Other Ambulatory Visit: Payer: Self-pay | Admitting: *Deleted

## 2011-05-21 NOTE — Telephone Encounter (Signed)
Pt left VM that he needed the following med refill but did not leave pharmacy. Left Pt detail message to return call to confirm pharmacy.

## 2011-05-22 MED ORDER — BENAZEPRIL HCL 40 MG PO TABS: 40.0000 mg | ORAL_TABLET | Freq: Every day | ORAL | Status: DC

## 2011-05-22 NOTE — Telephone Encounter (Signed)
Pt return call stating to send Rx to [facility]. Rx sent left Pt detail message.

## 2011-09-05 ENCOUNTER — Other Ambulatory Visit: Payer: Self-pay | Admitting: Internal Medicine

## 2011-11-09 ENCOUNTER — Telehealth: Payer: Self-pay | Admitting: Internal Medicine

## 2011-11-09 NOTE — Telephone Encounter (Signed)
ProctoFoam HC apply twice to 3 times a day following sitz baths

## 2011-11-09 NOTE — Telephone Encounter (Signed)
Pt states he has an acute hemorrhoid problem and would like Dr. Alwyn Ren to call him in something to [facility] on [address]. He states Dr. Alwyn Ren has treated this in the past. Please call pt at work [phone]

## 2011-11-09 NOTE — Telephone Encounter (Signed)
Last OV 03/2011 (CPX), Dr.Hopper please advise

## 2011-11-12 ENCOUNTER — Telehealth: Payer: Self-pay

## 2011-11-12 MED ORDER — HYDROCORTISONE ACE-PRAMOXINE 1-1 % RE FOAM: 1.0000 | Freq: Two times a day (BID) | RECTAL | Status: DC

## 2011-11-12 MED ORDER — HYDROCORTISONE ACE-PRAMOXINE 1-1 % RE FOAM: 1.0000 | Freq: Three times a day (TID) | RECTAL | Status: DC

## 2011-11-12 NOTE — Telephone Encounter (Signed)
Rx sent.    MW 

## 2011-11-12 NOTE — Telephone Encounter (Signed)
Pt called in concerning hemroid Rx. Advised him Rx sent to [facility] and gave pt number to call and check on Rx. Pt stated understanding.    MW

## 2011-11-12 NOTE — Telephone Encounter (Signed)
RX sent, (Message was routed to me today)

## 2011-12-06 ENCOUNTER — Other Ambulatory Visit: Payer: Self-pay

## 2011-12-06 NOTE — Telephone Encounter (Signed)
I do not know of any stronger medication; he should be seen by gastroenterologist if this is not working. This can be refilled until a GI referral can be made. Let me know if he has a preference of present

## 2011-12-06 NOTE — Telephone Encounter (Signed)
Pt left message on triage line stated needed hemroid Rx. I called pt back pt stated is there something stronger than this foam you Rx bleeding when going to bathroom. Plz advise        MW

## 2011-12-07 MED ORDER — HYDROCORTISONE ACE-PRAMOXINE 1-1 % RE FOAM: 1.0000 | Freq: Three times a day (TID) | RECTAL | Status: DC

## 2011-12-07 NOTE — Telephone Encounter (Signed)
Pt called back advised pt Rx sent and GI would need to be seen for stronger Rx. Pt stated he will use the med I sent in if not better he will call back for GI referral.       MW

## 2011-12-07 NOTE — Telephone Encounter (Signed)
Called left pt a message want pt to call. Rx sent and need to advise if med not working need to see gastr per [employer].   MW

## 2012-03-18 ENCOUNTER — Other Ambulatory Visit: Payer: Self-pay | Admitting: Orthopedic Surgery

## 2012-03-19 ENCOUNTER — Other Ambulatory Visit: Payer: Self-pay | Admitting: Internal Medicine

## 2012-03-19 NOTE — Telephone Encounter (Signed)
Patient needs to schedule a CPX  

## 2012-03-29 ENCOUNTER — Other Ambulatory Visit: Payer: Self-pay

## 2012-04-07 ENCOUNTER — Ambulatory Visit: Payer: BC Managed Care – PPO

## 2012-04-07 ENCOUNTER — Encounter: Payer: Self-pay | Admitting: Internal Medicine

## 2012-04-07 ENCOUNTER — Ambulatory Visit (INDEPENDENT_AMBULATORY_CARE_PROVIDER_SITE_OTHER): Payer: BC Managed Care – PPO | Admitting: Internal Medicine

## 2012-04-07 VITALS — BP 124/80 | HR 87 | Temp 98.0°F | Resp 14 | Ht 69.5 in | Wt 210.0 lb

## 2012-04-07 DIAGNOSIS — R7309 Other abnormal glucose: Secondary | ICD-10-CM

## 2012-04-07 DIAGNOSIS — Z0000 Encounter for general adult medical examination without abnormal findings: Secondary | ICD-10-CM

## 2012-04-07 DIAGNOSIS — Z86010 Personal history of colonic polyps: Secondary | ICD-10-CM

## 2012-04-07 DIAGNOSIS — E785 Hyperlipidemia, unspecified: Secondary | ICD-10-CM

## 2012-04-07 DIAGNOSIS — I10 Essential (primary) hypertension: Secondary | ICD-10-CM

## 2012-04-07 LAB — LIPID PANEL
HDL: 41 mg/dL (ref >39.00)
LDL Cholesterol: 128 mg/dL — ABNORMAL HIGH (ref 0–99)
Total CHOL/HDL Ratio: 5
Triglycerides: 114 mg/dL (ref 0.0–149.0)

## 2012-04-07 LAB — BASIC METABOLIC PANEL
Calcium: 9.6 mg/dL (ref 8.4–10.5)
GFR: 67.93 mL/min (ref >60.00)
Glucose, Bld: 124 mg/dL — ABNORMAL HIGH (ref 70–99)
Sodium: 140 mEq/L (ref 135–145)

## 2012-04-07 LAB — CBC WITH DIFFERENTIAL/PLATELET
Eosinophils Relative: 3.7 % (ref 0.0–5.0)
Lymphocytes Relative: 24.8 % (ref 12.0–46.0)
MCV: 89.9 fl (ref 78.0–100.0)
Monocytes Absolute: 0.6 10*3/uL (ref 0.1–1.0)
Monocytes Relative: 11.2 % (ref 3.0–12.0)
Neutrophils Relative %: 59.9 % (ref 43.0–77.0)
Platelets: 144 10*3/uL — ABNORMAL LOW (ref 150.0–400.0)
RBC: 5.07 Mil/uL (ref 4.22–5.81)
WBC: 4.9 10*3/uL (ref 4.5–10.5)

## 2012-04-07 LAB — HEPATIC FUNCTION PANEL
ALT: 18 U/L (ref 0–53)
Alkaline Phosphatase: 65 U/L (ref 39–117)
Bilirubin, Direct: 0.1 mg/dL (ref 0.0–0.3)
Total Bilirubin: 0.9 mg/dL (ref 0.3–1.2)

## 2012-04-07 LAB — HEMOGLOBIN A1C: Hgb A1c MFr Bld: 6.8 % — ABNORMAL HIGH (ref 4.6–6.5)

## 2012-04-07 LAB — TSH: TSH: 1.57 u[IU]/mL (ref 0.35–5.50)

## 2012-04-07 NOTE — Progress Notes (Signed)
  Subjective:    Patient ID: Bryce Hurst, male    DOB: [birth year], 62 y.o.   MRN: [id number]  HPI  Bryce Hurst is here for a physical; he denies acute issues but he has pending cataract & arthroscopic knee surgery pending.    Review of Systems  HYPERTENSION follow-up: Home blood pressure range 120-140/85-93. Patient is compliant with medications. No adverse effects noted from medication. Eercise program as swimming 1  time per week for  15-30 minutes. No specific dietary program .  No chest pain, palpitations, dyspnea, claudication,edema or paroxysmal nocturnal dyspnea described. No significant lightheadedness, headache, epistaxis, or syncope.          Objective:   Physical Exam Gen.: Healthy and well-nourished in appearance. Alert, appropriate and cooperative throughout exam.Appears younger than stated age  Head: Normocephalic without obvious abnormalities.  Eyes: No corneal or conjunctival inflammation noted. Pupils equal round reactive to light and accommodation.  Extraocular motion intact. Vision grossly normal with lenses Ears: External  ear exam reveals no significant lesions or deformities. Canals clear .TMs normal. Hearing is grossly normal bilaterally. Nose: External nasal exam reveals no deformity or inflammation. Nasal mucosa are pink and moist. No lesions or exudates noted.   Mouth: Oral mucosa and oropharynx reveal no lesions or exudates. Teeth in good repair. Neck: No deformities, masses, or tenderness noted. Range of motion & Thyroid normal. Lungs: Normal respiratory effort; chest expands symmetrically. Lungs are clear to auscultation without rales, wheezes, or increased work of breathing. Heart: Normal rate and rhythm. Normal S1 and S2. No gallop, click, or rub. S4 w/o murmur. Abdomen: Bowel sounds normal; abdomen soft and nontender. No masses, organomegaly or hernias noted. Genitalia: Genitalia normal except for left varices. Prostate is not enlarged but the left  lobe is slightly irregular with  smooth nodular character inferiorly.                                  Musculoskeletal/extremities: No deformity or scoliosis noted of  the thoracic or lumbar spine.  No clubbing, cyanosis, edema, or significant extremity  deformity noted. Range of motion normal .Tone & strength  Normal. Joints normal . Nail health good. Able to lie down & sit up w/o help. Negative SLR bilaterally Vascular: Carotid, radial artery, dorsalis pedis and  posterior tibial pulses are full and equal. No bruits present. Neurologic: Alert and oriented x3. Deep tendon reflexes symmetrical and normal.  Gait normal   .        Skin: Intact witht suspicious lesions or rashes. SQ lipomata of abdomen. Lymph: No cervical, axillary, or inguinal lymphadenopathy present. Psych: Mood and affect are normal. Normally interactive                                                                                         Assessment & Plan:  #1 comprehensive physical exam; no acute findings  #2 no medical contraindication to the planned cataract and arthroscopic knee surgeries  #3 slight nodular change of the prostate  Plan: see Orders  & Recommendations

## 2012-04-07 NOTE — Patient Instructions (Addendum)
Preventive Health Care: Exercise at least 30-45 minutes a day,  3-4 days a week.  Eat a low-fat diet with lots of fruits and vegetables, up to 7-9 servings per day. This would eliminate the need for vitamin supplements. Consume less than 40 grams of sugar (preferably ZERO) per day from foods & drinks with High Fructose Corn Sugar as #1,2,3 or # 4 on label. Health Care Power of Attorney & Living Will. Complete these if not in place ; these place you in charge of your health care decisions. As per the Standard of Care , screening Colonoscopy recommended @ 50 & every 5-10 years thereafter . More frequent monitor would be dictated by family history or findings @ Colonoscopy  Minimal Blood Pressure Goal= AVERAGE < 140/90;  Ideal is an AVERAGE < 135/85. This AVERAGE should be calculated from @ least 5-7 BP readings taken @ different times of day on different days of week. You should not respond to isolated BP readings , but rather the AVERAGE for that week .Please bring your  blood pressure cuff to office visits to verify that it is reliable.It  can also be checked against the blood pressure device at the pharmacy. Finger or wrist cuffs are not dependable; an arm cuff is. To prevent palpitations or premature beats, avoid stimulants such as decongestants, diet pills, nicotine, or caffeine (coffee, tea, cola, or chocolate) to excess. Review and correct the record as indicated. Please share record with all medical staff seen.

## 2012-04-07 NOTE — Addendum Note (Signed)
Addended byPecola Lawless on: 04/07/2012 12:28 PM   Modules accepted: Orders

## 2012-04-21 ENCOUNTER — Encounter (HOSPITAL_BASED_OUTPATIENT_CLINIC_OR_DEPARTMENT_OTHER): Payer: Self-pay | Admitting: *Deleted

## 2012-04-21 NOTE — Progress Notes (Signed)
NPO AFTER MN. ARRIVES AT 1030. NEEDS ISTAT. CURRENT EKG IN EPIC AND CHART.

## 2012-04-22 ENCOUNTER — Ambulatory Visit (AMBULATORY_SURGERY_CENTER): Payer: BC Managed Care – PPO | Admitting: *Deleted

## 2012-04-22 VITALS — Ht 69.0 in | Wt 212.0 lb

## 2012-04-22 DIAGNOSIS — Z800 Family history of malignant neoplasm of digestive organs: Secondary | ICD-10-CM

## 2012-04-22 DIAGNOSIS — Z1211 Encounter for screening for malignant neoplasm of colon: Secondary | ICD-10-CM

## 2012-04-22 MED ORDER — PEG-KCL-NACL-NASULF-NA ASC-C 100 G PO SOLR: ORAL | Status: DC

## 2012-04-22 NOTE — Progress Notes (Signed)
No egg or soy allergy 

## 2012-04-23 ENCOUNTER — Encounter: Payer: Self-pay | Admitting: Internal Medicine

## 2012-04-25 ENCOUNTER — Encounter (HOSPITAL_BASED_OUTPATIENT_CLINIC_OR_DEPARTMENT_OTHER): Payer: Self-pay | Admitting: *Deleted

## 2012-04-25 ENCOUNTER — Ambulatory Visit (HOSPITAL_BASED_OUTPATIENT_CLINIC_OR_DEPARTMENT_OTHER)
Admission: RE | Admit: 2012-04-25 | Discharge: 2012-04-25 | Disposition: A | Discharge status: Home / Self Care | Payer: BC Managed Care – PPO | Source: Ambulatory Visit | Attending: Orthopedic Surgery | Admitting: Orthopedic Surgery

## 2012-04-25 ENCOUNTER — Ambulatory Visit (HOSPITAL_BASED_OUTPATIENT_CLINIC_OR_DEPARTMENT_OTHER): Payer: BC Managed Care – PPO | Admitting: Anesthesiology

## 2012-04-25 ENCOUNTER — Encounter (HOSPITAL_BASED_OUTPATIENT_CLINIC_OR_DEPARTMENT_OTHER)
Admission: RE | Disposition: A | Discharge status: Home / Self Care | Payer: Self-pay | Source: Ambulatory Visit | Attending: Orthopedic Surgery

## 2012-04-25 ENCOUNTER — Encounter (HOSPITAL_BASED_OUTPATIENT_CLINIC_OR_DEPARTMENT_OTHER): Payer: Self-pay | Admitting: Anesthesiology

## 2012-04-25 DIAGNOSIS — X58XXXA Exposure to other specified factors, initial encounter: Secondary | ICD-10-CM | POA: Insufficient documentation

## 2012-04-25 DIAGNOSIS — Z79899 Other long term (current) drug therapy: Secondary | ICD-10-CM | POA: Insufficient documentation

## 2012-04-25 DIAGNOSIS — Z9889 Other specified postprocedural states: Secondary | ICD-10-CM

## 2012-04-25 DIAGNOSIS — IMO0002 Reserved for concepts with insufficient information to code with codable children: Secondary | ICD-10-CM | POA: Insufficient documentation

## 2012-04-25 DIAGNOSIS — E785 Hyperlipidemia, unspecified: Secondary | ICD-10-CM | POA: Insufficient documentation

## 2012-04-25 DIAGNOSIS — I10 Essential (primary) hypertension: Secondary | ICD-10-CM | POA: Insufficient documentation

## 2012-04-25 HISTORY — PX: KNEE ARTHROSCOPY WITH MEDIAL MENISECTOMY: SHX5651

## 2012-04-25 HISTORY — DX: Unspecified tear of unspecified meniscus, current injury, unspecified knee, initial encounter: S83.209A

## 2012-04-25 HISTORY — DX: Unspecified cataract: H26.9

## 2012-04-25 LAB — POCT I-STAT 4, (NA,K, GLUC, HGB,HCT)
Glucose, Bld: 118 mg/dL — ABNORMAL HIGH (ref 70–99)
HCT: 44 % (ref 39.0–52.0)
Sodium: 141 mEq/L (ref 135–145)

## 2012-04-25 SURGERY — ARTHROSCOPY, KNEE, WITH MEDIAL MENISCECTOMY
Anesthesia: General | Site: Knee | Laterality: Right | Wound class: Clean

## 2012-04-25 MED ORDER — BUPIVACAINE-EPINEPHRINE 0.5% -1:200000 IJ SOLN
INTRAMUSCULAR | Status: DC | PRN
  Administered 2012-04-25: 20 mL

## 2012-04-25 MED ORDER — EPINEPHRINE HCL 1 MG/ML IJ SOLN
INTRAMUSCULAR | Status: DC | PRN
  Administered 2012-04-25: 2 mL

## 2012-04-25 MED ORDER — HYDROCODONE-ACETAMINOPHEN 7.5-325 MG PO TABS
1.0000 | ORAL_TABLET | Freq: Four times a day (QID) | ORAL | Status: DC | PRN
  Administered 2012-04-25: 1 via ORAL
  Filled 2012-04-25: qty 1

## 2012-04-25 MED ORDER — HYDROCODONE-ACETAMINOPHEN 7.5-325 MG PO TABS: 1.0000 | ORAL_TABLET | Freq: Four times a day (QID) | ORAL | Status: DC | PRN

## 2012-04-25 MED ORDER — ONDANSETRON HCL 4 MG/2ML IJ SOLN
INTRAMUSCULAR | Status: DC | PRN
  Administered 2012-04-25: 4 mg via INTRAVENOUS

## 2012-04-25 MED ORDER — DEXAMETHASONE SODIUM PHOSPHATE 4 MG/ML IJ SOLN
INTRAMUSCULAR | Status: DC | PRN
  Administered 2012-04-25: 10 mg via INTRAVENOUS

## 2012-04-25 MED ORDER — KETOROLAC TROMETHAMINE 30 MG/ML IJ SOLN
INTRAMUSCULAR | Status: DC | PRN
  Administered 2012-04-25: 30 mg via INTRAVENOUS

## 2012-04-25 MED ORDER — POVIDONE-IODINE 7.5 % EX SOLN
Freq: Once | CUTANEOUS | Status: DC
  Filled 2012-04-25: qty 118

## 2012-04-25 MED ORDER — LIDOCAINE HCL (CARDIAC) 20 MG/ML IV SOLN
INTRAVENOUS | Status: DC | PRN
  Administered 2012-04-25: 100 mg via INTRAVENOUS

## 2012-04-25 MED ORDER — MORPHINE SULFATE 4 MG/ML IJ SOLN
INTRAMUSCULAR | Status: DC | PRN
  Administered 2012-04-25: 4 mg via INTRAVENOUS

## 2012-04-25 MED ORDER — LACTATED RINGERS IV SOLN
INTRAVENOUS | Status: DC | PRN
  Administered 2012-04-25 (×2): via INTRAVENOUS

## 2012-04-25 MED ORDER — MIDAZOLAM HCL 5 MG/5ML IJ SOLN
INTRAMUSCULAR | Status: DC | PRN
  Administered 2012-04-25: 2 mg via INTRAVENOUS

## 2012-04-25 MED ORDER — PROPOFOL 10 MG/ML IV BOLUS
INTRAVENOUS | Status: DC | PRN
  Administered 2012-04-25: 250 mg via INTRAVENOUS

## 2012-04-25 MED ORDER — PROMETHAZINE HCL 25 MG/ML IJ SOLN
6.2500 mg | INTRAMUSCULAR | Status: DC | PRN
  Filled 2012-04-25: qty 1

## 2012-04-25 MED ORDER — ALBUTEROL SULFATE (5 MG/ML) 0.5% IN NEBU
2.5000 mg | INHALATION_SOLUTION | Freq: Four times a day (QID) | RESPIRATORY_TRACT | Status: DC | PRN
  Administered 2012-04-25: 2.5 mg via RESPIRATORY_TRACT
  Filled 2012-04-25: qty 0.5

## 2012-04-25 MED ORDER — SODIUM CHLORIDE 0.9 % IR SOLN
Status: DC | PRN
  Administered 2012-04-25: 12000 mL

## 2012-04-25 MED ORDER — FENTANYL CITRATE 0.05 MG/ML IJ SOLN
INTRAMUSCULAR | Status: DC | PRN
  Administered 2012-04-25 (×4): 50 ug via INTRAVENOUS

## 2012-04-25 MED ORDER — FENTANYL CITRATE 0.05 MG/ML IJ SOLN
25.0000 ug | INTRAMUSCULAR | Status: DC | PRN
  Administered 2012-04-25 (×2): 25 ug via INTRAVENOUS
  Filled 2012-04-25: qty 1

## 2012-04-25 MED ORDER — LACTATED RINGERS IV SOLN
INTRAVENOUS | Status: DC
  Administered 2012-04-25 (×2): via INTRAVENOUS
  Filled 2012-04-25: qty 1000

## 2012-04-25 SURGICAL SUPPLY — 51 items
BANDAGE ELASTIC 6 VELCRO ST LF (GAUZE/BANDAGES/DRESSINGS) ×2 IMPLANT
BANDAGE ESMARK 6X9 LF (GAUZE/BANDAGES/DRESSINGS) ×1 IMPLANT
BANDAGE GAUZE ELAST BULKY 4 IN (GAUZE/BANDAGES/DRESSINGS) ×2 IMPLANT
BLADE 4.2CUDA (BLADE) IMPLANT
BLADE CUDA 5.5 (BLADE) IMPLANT
BLADE CUDA SHAVER 3.5 (BLADE) ×2 IMPLANT
BLADE CUTTER GATOR 3.5 (BLADE) IMPLANT
BLADE GREAT WHITE 4.2 (BLADE) IMPLANT
BNDG ESMARK 6X9 LF (GAUZE/BANDAGES/DRESSINGS) ×2
CANISTER SUCT LVC 12 LTR MEDI- (MISCELLANEOUS) ×2 IMPLANT
CANISTER SUCTION 1200CC (MISCELLANEOUS) ×2 IMPLANT
CLOTH BEACON ORANGE TIMEOUT ST (SAFETY) ×2 IMPLANT
DRAPE ARTHROSCOPY W/POUCH 114 (DRAPES) ×2 IMPLANT
DRAPE LG THREE QUARTER DISP (DRAPES) ×2 IMPLANT
DRSG EMULSION OIL 3X3 NADH (GAUZE/BANDAGES/DRESSINGS) ×2 IMPLANT
DRSG PAD ABDOMINAL 8X10 ST (GAUZE/BANDAGES/DRESSINGS) ×2 IMPLANT
DURAPREP 26ML APPLICATOR (WOUND CARE) ×2 IMPLANT
ELECT MENISCUS 165MM 90D (ELECTRODE) IMPLANT
ELECT REM PT RETURN 9FT ADLT (ELECTROSURGICAL)
ELECTRODE REM PT RTRN 9FT ADLT (ELECTROSURGICAL) IMPLANT
GAUZE SPONGE 4X4 12PLY STRL LF (GAUZE/BANDAGES/DRESSINGS) ×2 IMPLANT
GLOVE BIOGEL PI IND STRL 7.5 (GLOVE) ×1 IMPLANT
GLOVE BIOGEL PI IND STRL 8 (GLOVE) ×1 IMPLANT
GLOVE BIOGEL PI INDICATOR 7.5 (GLOVE) ×1
GLOVE BIOGEL PI INDICATOR 8 (GLOVE) ×1
GLOVE ECLIPSE 7.5 STRL STRAW (GLOVE) ×2 IMPLANT
GLOVE ECLIPSE 8.0 STRL XLNG CF (GLOVE) ×4 IMPLANT
GLOVE INDICATOR 8.0 STRL GRN (GLOVE) ×4 IMPLANT
GOWN PREVENTION PLUS LG XLONG (DISPOSABLE) ×2 IMPLANT
GOWN STRL REIN XL XLG (GOWN DISPOSABLE) ×2 IMPLANT
IV NS IRRIG 3000ML ARTHROMATIC (IV SOLUTION) ×4 IMPLANT
KNEE WRAP E Z 3 GEL PACK (MISCELLANEOUS) ×2 IMPLANT
NDL SAFETY ECLIPSE 18X1.5 (NEEDLE) ×1 IMPLANT
NEEDLE HYPO 18GX1.5 BLUNT FILL (NEEDLE) ×2 IMPLANT
NEEDLE HYPO 18GX1.5 SHARP (NEEDLE) ×1
PACK ARTHROSCOPY DSU (CUSTOM PROCEDURE TRAY) ×2 IMPLANT
PACK BASIN DAY SURGERY FS (CUSTOM PROCEDURE TRAY) ×2 IMPLANT
PADDING CAST ABS 4INX4YD NS (CAST SUPPLIES) ×1
PADDING CAST ABS COTTON 4X4 ST (CAST SUPPLIES) ×1 IMPLANT
PENCIL BUTTON HOLSTER BLD 10FT (ELECTRODE) IMPLANT
SET ARTHROSCOPY TUBING (MISCELLANEOUS) ×1
SET ARTHROSCOPY TUBING LN (MISCELLANEOUS) ×1 IMPLANT
SPONGE GAUZE 4X4 12PLY (GAUZE/BANDAGES/DRESSINGS) ×2 IMPLANT
SUT ETHIBOND 2 OS 4 DA (SUTURE) IMPLANT
SUT ETHILON 4 0 PS 2 18 (SUTURE) ×2 IMPLANT
SUT VIC AB 0 CT1 36 (SUTURE) IMPLANT
SUT VIC AB 2-0 PS2 27 (SUTURE) IMPLANT
SYRINGE 10CC LL (SYRINGE) ×2 IMPLANT
TOWEL OR 17X24 6PK STRL BLUE (TOWEL DISPOSABLE) ×2 IMPLANT
WAND 90 DEG TURBOVAC W/CORD (SURGICAL WAND) IMPLANT
WATER STERILE IRR 500ML POUR (IV SOLUTION) ×2 IMPLANT

## 2012-04-25 NOTE — Anesthesia Procedure Notes (Signed)
Procedure Name: LMA Insertion Date/Time: 04/25/2012 1:56 PM Performed by: Jessica Priest Pre-anesthesia Checklist: Patient identified, Emergency Drugs available, Suction available and Patient being monitored Patient Re-evaluated:Patient Re-evaluated prior to inductionOxygen Delivery Method: Circle System Utilized Preoxygenation: Pre-oxygenation with 100% oxygen Intubation Type: IV induction Ventilation: Mask ventilation without difficulty LMA: LMA with gastric port inserted LMA Size: 5.0 Number of attempts: 1 Placement Confirmation: positive ETCO2 Tube secured with: Tape Dental Injury: Teeth and Oropharynx as per pre-operative assessment

## 2012-04-25 NOTE — Progress Notes (Signed)
Dr. Okey Dupre informed of )2 sat > 92 x last 15 min.  However when sleepy he will desat to 86-88%.  After using IS he will increase to 94% or >.  Pt still has productive cough of thin watery red secretions.   Pt has redness to left lateral sclera.  Noted about 6 pm. Ok for d/c to home per Dr. Okey Dupre. w instructions to use IS q 1-2 hrs w/a.  Pt to take bp med when he goes home.  Pt denies sob, chest pain.

## 2012-04-25 NOTE — Brief Op Note (Signed)
04/25/2012  3:22 PM  PATIENT:  Bryce Hurst  62 y.o. male  PRE-OPERATIVE DIAGNOSIS:  right knee torn medial meniscus  POST-OPERATIVE DIAGNOSIS:  right knee torn medial meniscus  PROCEDURE:  Procedure(s) with comments: KNEE ARTHROSCOPY WITH MEDIAL MENISECTOMY   (Right) - right knee arthroscopy with partial medial meniscectomy   SURGEON:  Surgeon(s) and Role:    * Drucilla Schmidt, MD - Primary  PHYSICIAN ASSISTANT:   ASSISTANTS: nurse  ANESTHESIA:   general  EBL:  Total I/O In: 1900 [I.V.:1900] Out: -   BLOOD ADMINISTERED:none  DRAINS: none   LOCAL MEDICATIONS USED:  MARCAINE     SPECIMEN:  No Specimen  DISPOSITION OF SPECIMEN:  N/A  COUNTS:  YES  TOURNIQUET:   Total Tourniquet Time Documented: Thigh (Right) - 47 minutes Total: Thigh (Right) - 47 minutes   DICTATION: .Other Dictation: Dictation Number [id number]  PLAN OF CARE: Discharge to home after PACU  PATIENT DISPOSITION:  PACU - hemodynamically stable.   Delay start of Pharmacological VTE agent (>24hrs) due to surgical blood loss or risk of bleeding: yes

## 2012-04-25 NOTE — Transfer of Care (Signed)
Immediate Anesthesia Transfer of Care Note  Patient: Bryce Hurst  Procedure(s) Performed: Procedure(s) (LRB): KNEE ARTHROSCOPY WITH MEDIAL MENISECTOMY   (Right)  Patient Location: PACU  Anesthesia Type: General  Level of Consciousness: awake, sedated, patient cooperative and responds to stimulation  Airway & Oxygen Therapy: Patient Spontanous Breathing and Patient connected to face mask oxygen  Post-op Assessment: Report given to PACU RN, Post -op Vital signs reviewed and stable and Patient moving all extremities  Post vital signs: Reviewed and stable  Complications: No apparent anesthesia complications

## 2012-04-25 NOTE — H&P (Signed)
Bryce Hurst is an 62 y.o. male.   Chief Complaint: painful rt knee  HPI: MRI demonstrates  Torn medial meniscus  Past Medical History  Diagnosis Date  . Hyperlipidemia   . Hypertension   . Acute meniscal tear of knee     right  . Cataract of both eyes     Past Surgical History  Procedure Laterality Date  . Colonoscopy w/ polypectomy  1996    negative 2003 (was due 2006-2008)  . Mouth surgery      maxillary bone graft  (ORAL SURGEON OFFICE)    Family History  Problem Relation Age of Onset  . Diabetes Mother   . Hypertension Mother   . Heart disease Mother     MI X4 , initially @ 66  . Stroke Mother 72  . Cancer Father     colon & lung  . Colon cancer Father   . Hypertension Brother   . Cancer Brother     colon & lung  . Colon cancer Brother   . Breast cancer Paternal Aunt   . Cancer Paternal Uncle     prostate  . Esophageal cancer Neg Hx   . Stomach cancer Neg Hx   . Rectal cancer Neg Hx    Social History:  reports that he has never smoked. He has never used smokeless tobacco. He reports that he does not drink alcohol or use illicit drugs.  Allergies: No Known Allergies  Medications Prior to Admission  Medication Sig Dispense Refill  . benazepril (LOTENSIN) 40 MG tablet Take 40 mg by mouth every morning.      . peg 3350 powder (MOVIPREP) 100 G SOLR Moviprep as directed, no substitutions  1 kit  0    Results for orders placed during the hospital encounter of 04/25/12 (from the past 48 hour(s))  POCT I-STAT 4, (NA,K, GLUC, HGB,HCT)     Status: Abnormal   Collection Time    04/25/12 11:16 AM      Result Value Range   Sodium 141  135 - 145 mEq/L   Potassium 4.2  3.5 - 5.1 mEq/L   Glucose, Bld 118 (*) 70 - 99 mg/dL   HCT 11.9  14.7 - 82.9 %   Hemoglobin 15.0  13.0 - 17.0 g/dL   No results found.  ROS  Blood pressure 144/93, pulse 73, temperature 97.6 F (36.4 C), temperature source Oral, resp. rate 20, height 5' 9.5" (1.765 m), weight 94.944 kg (209  lb 5 oz), SpO2 95.00%. Physical Exam  Constitutional: He is oriented to person, place, and time. He appears well-developed and well-nourished.  HENT:  Head: Normocephalic and atraumatic.  Right Ear: External ear normal.  Left Ear: External ear normal.  Nose: Nose normal.  Mouth/Throat: Oropharynx is clear and moist.  Eyes: Conjunctivae and EOM are normal. Pupils are equal, round, and reactive to light.  Neck: Normal range of motion. Neck supple.  Cardiovascular: Normal rate, regular rhythm, normal heart sounds and intact distal pulses.   Respiratory: Effort normal and breath sounds normal.  GI: Soft. Bowel sounds are normal.  Musculoskeletal: Normal range of motion. He exhibits tenderness.  tender medial joint linner knee  Neurological: He is alert and oriented to person, place, and time. He has normal reflexes.  Skin: Skin is warm and dry.  Psychiatric: He has a normal mood and affect. His behavior is normal. Judgment and thought content normal.     Assessment/Plan Torn medial meniscus rt knee Rt knee arthroscopy with  partial medial meniscectomy  APLINGTON,JAMES P 04/25/2012, 1:44 PM

## 2012-04-25 NOTE — Progress Notes (Signed)
Dr. Okey Dupre in aware sats better w IS use.  Instructed pt to use IS q 1-2 hrs w/a x 24-hrs per Dr. Okey Dupre.

## 2012-04-25 NOTE — Anesthesia Preprocedure Evaluation (Signed)
Anesthesia Evaluation  Patient identified by MRN, date of birth, ID band Patient awake    Reviewed: Allergy & Precautions, H&P , NPO status , Patient's Chart, lab work & pertinent test results  Airway Mallampati: II TM Distance: >3 FB Neck ROM: Full    Dental no notable dental hx.    Pulmonary neg pulmonary ROS,  breath sounds clear to auscultation  Pulmonary exam normal       Cardiovascular Exercise Tolerance: Good hypertension, Pt. on medications Rhythm:Regular Rate:Normal     Neuro/Psych negative neurological ROS  negative psych ROS   GI/Hepatic negative GI ROS, Neg liver ROS,   Endo/Other  Fasting hyperglycemia  Renal/GU negative Renal ROS  negative genitourinary   Musculoskeletal negative musculoskeletal ROS (+)   Abdominal (+) + obese,   Peds negative pediatric ROS (+)  Hematology negative hematology ROS (+)   Anesthesia Other Findings   Reproductive/Obstetrics negative OB ROS                           Anesthesia Physical Anesthesia Plan  ASA: II  Anesthesia Plan: General   Post-op Pain Management:    Induction: Intravenous  Airway Management Planned: LMA  Additional Equipment:   Intra-op Plan:   Post-operative Plan: Extubation in OR  Informed Consent: I have reviewed the patients History and Physical, chart, labs and discussed the procedure including the risks, benefits and alternatives for the proposed anesthesia with the patient or authorized representative who has indicated his/her understanding and acceptance.   Dental advisory given  Plan Discussed with: CRNA  Anesthesia Plan Comments:         Anesthesia Quick Evaluation

## 2012-04-28 ENCOUNTER — Encounter (HOSPITAL_BASED_OUTPATIENT_CLINIC_OR_DEPARTMENT_OTHER): Payer: Self-pay | Admitting: Orthopedic Surgery

## 2012-04-28 NOTE — Anesthesia Postprocedure Evaluation (Signed)
Anesthesia Post Note  Patient: Bryce Hurst  Procedure(s) Performed: Procedure(s) (LRB): KNEE ARTHROSCOPY WITH MEDIAL MENISECTOMY   (Right)  Anesthesia type: General  Patient location: PACU  Post pain: Pain level controlled  Post assessment: Post-op Vital signs reviewed  Last Vitals:  Filed Vitals:   04/25/12 1800  BP: 157/88  Pulse: 90  Temp: 36.5 C  Resp: 16    Post vital signs: Reviewed  Level of consciousness: sedated  Complications: No apparent anesthesia complications

## 2012-04-28 NOTE — Op Note (Signed)
Bryce Hurst, Bryce Hurst NO.:  [id number]  MEDICAL RECORD NO.:  [id number]  LOCATION:                                FACILITY:  [facility]  PHYSICIAN:  Marlowe Kays, M.D.  DATE OF BIRTH:  [birth year]  DATE OF PROCEDURE:  04/25/2012 DATE OF DISCHARGE:                              OPERATIVE REPORT   PREOPERATIVE DIAGNOSIS:  Torn medial meniscus, right knee.  POSTOPERATIVE DIAGNOSIS:  Torn medial meniscus, right knee.  OPERATION:  Right knee arthroscopy with partial medial meniscectomy.  SURGEON:  Marlowe Kays, MD  ASSISTANT:  Nurse.  ANESTHESIA:  General.  PATHOLOGY AND JUSTIFICATION FOR PROCEDURE:  Painful right knee with an MRI demonstrating complex tear of the posterior portion of the meniscus. This was confirmed with surgery.  PROCEDURE:  Satisfactory general anesthesia, Ace wrap, and knee support to left lower extremity, pneumatic tourniquet applied to the right lower extremity with the leg Esmarch out nonsterilely and the tourniquet inflated to 325 mmHg.  Thigh stabilizer applied.  Right leg was then prepped with DuraPrep from stabilizer to ankle and draped in sterile field.  Time-out performed.  Superior medial saline inflow.  First, an anterolateral portal, medial compartment knee joint was evaluated.  He had a good bit of synovitis anteriorly which I resected with a 3.5 shaver.  This appeared to be in digging into the articular cartilage of the medial femoral condyle with a grade 1 lesion there.  It did not need to be shaved.  Looking posteriorly, he had extensive tearing with a flap at the posterior curve all the way into the intercondylar area which I resected back to a stable rim with a combination of baskets and 3.5 shaver.  Final pictures were taken.  Looking up in the medial gutter and suprapatellar area, patella looked normal.  I then reversed portals. The lateral compartment of the knee joint looked normal.  Knee joint was irrigated to clear  and all fluid possible removed.  I closed 2 entry portals with 4-0 nylon and then injected 20 mL of 0.5% Marcaine with adrenaline and 4 mg of morphine through the inflow apparatus which I closed with 4-0 nylon as well.  Betadine, Adaptic, dry sterile dressing were applied.  Tourniquet was released.  He tolerated the procedure well, was taken to the recovery room in satisfactory condition with no known complications.         ______________________________ Marlowe Kays, M.D.    JA/MEDQ  D:  04/25/2012  T:  04/26/2012  Job:  [id number]

## 2012-05-10 HISTORY — PX: ORTHOPEDIC SURGERY: SHX850

## 2012-05-13 ENCOUNTER — Encounter: Payer: BC Managed Care – PPO | Admitting: Internal Medicine

## 2012-05-27 ENCOUNTER — Other Ambulatory Visit: Payer: Self-pay | Admitting: Internal Medicine

## 2012-05-27 HISTORY — PX: CATARACT EXTRACTION, BILATERAL: SHX1313

## 2012-07-11 ENCOUNTER — Ambulatory Visit (AMBULATORY_SURGERY_CENTER): Payer: BC Managed Care – PPO | Admitting: Internal Medicine

## 2012-07-11 ENCOUNTER — Encounter: Payer: Self-pay | Admitting: Internal Medicine

## 2012-07-11 VITALS — BP 130/93 | HR 68 | Temp 97.9°F | Resp 15 | Ht 69.0 in | Wt 212.0 lb

## 2012-07-11 DIAGNOSIS — D126 Benign neoplasm of colon, unspecified: Secondary | ICD-10-CM

## 2012-07-11 DIAGNOSIS — Z1211 Encounter for screening for malignant neoplasm of colon: Secondary | ICD-10-CM

## 2012-07-11 DIAGNOSIS — Z800 Family history of malignant neoplasm of digestive organs: Secondary | ICD-10-CM

## 2012-07-11 MED ORDER — SODIUM CHLORIDE 0.9 % IV SOLN: 500.0000 mL | INTRAVENOUS | Status: DC

## 2012-07-11 NOTE — Patient Instructions (Addendum)
Impressions/recommendations:  Polyps (handout given) High fiber diet (handout given)  YOU HAD AN ENDOSCOPIC PROCEDURE TODAY AT THE [facility]: Refer to the procedure report that was given to you for any specific questions about what was found during the examination.  If the procedure report does not answer your questions, please call your gastroenterologist to clarify.  If you requested that your care partner not be given the details of your procedure findings, then the procedure report has been included in a sealed envelope for you to review at your convenience later.  YOU SHOULD EXPECT: Some feelings of bloating in the abdomen. Passage of more gas than usual.  Walking can help get rid of the air that was put into your GI tract during the procedure and reduce the bloating. If you had a lower endoscopy (such as a colonoscopy or flexible sigmoidoscopy) you may notice spotting of blood in your stool or on the toilet paper. If you underwent a bowel prep for your procedure, then you may not have a normal bowel movement for a few days.  DIET: Your first meal following the procedure should be a light meal and then it is ok to progress to your normal diet.  A half-sandwich or bowl of soup is an example of a good first meal.  Heavy or fried foods are harder to digest and may make you feel nauseous or bloated.  Likewise meals heavy in dairy and vegetables can cause extra gas to form and this can also increase the bloating.  Drink plenty of fluids but you should avoid alcoholic beverages for 24 hours.  ACTIVITY: Your care partner should take you home directly after the procedure.  You should plan to take it easy, moving slowly for the rest of the day.  You can resume normal activity the day after the procedure however you should NOT DRIVE or use heavy machinery for 24 hours (because of the sedation medicines used during the test).    SYMPTOMS TO REPORT IMMEDIATELY: A gastroenterologist can be  reached at any hour.  During normal business hours, 8:30 AM to 5:00 PM Monday through Friday, call [phone].  After hours and on weekends, please call the GI answering service at [phone] who will take a message and have the physician on call contact you.   Following lower endoscopy (colonoscopy or flexible sigmoidoscopy):  Excessive amounts of blood in the stool  Significant tenderness or worsening of abdominal pains  Swelling of the abdomen that is new, acute  Fever of 100F or higher  FOLLOW UP: If any biopsies were taken you will be contacted by phone or by letter within the next 1-3 weeks.  Call your gastroenterologist if you have not heard about the biopsies in 3 weeks.  Our staff will call the home number listed on your records the next business day following your procedure to check on you and address any questions or concerns that you may have at that time regarding the information given to you following your procedure. This is a courtesy call and so if there is no answer at the home number and we have not heard from you through the emergency physician on call, we will assume that you have returned to your regular daily activities without incident.  SIGNATURES/CONFIDENTIALITY: You and/or your care partner have signed paperwork which will be entered into your electronic medical record.  These signatures attest to the fact that that the information above on your After Visit Summary has been reviewed  and is understood.  Full responsibility of the confidentiality of this discharge information lies with you and/or your care-partner. 

## 2012-07-11 NOTE — Progress Notes (Signed)
Procedure ends, to recovery awake, report given, VSS.

## 2012-07-11 NOTE — Progress Notes (Signed)
Patient did not have preoperative order for IV antibiotic SSI prophylaxis. (G8918)  Patient did not experience any of the following events: a burn prior to discharge; a fall within the facility; wrong site/side/patient/procedure/implant event; or a hospital transfer or hospital admission upon discharge from the facility. (G8907)  

## 2012-07-11 NOTE — Op Note (Signed)
[facility] [address] [place], [address]   COLONOSCOPY PROCEDURE REPORT  PATIENT: Bryce Hurst, Bryce Hurst  MR#: [id number] BIRTHDATE: [birth year] , 61  yrs. old GENDER: Male ENDOSCOPIST: Hart Carwin, MD REFERRED BY:  Marga Melnick, M.D. PROCEDURE DATE:  07/11/2012 PROCEDURE:   Colonoscopy with cold biopsy polypectomy ASA CLASS:   Class II INDICATIONS:, last colonoscopy in 2003, parent with colon cancer, MEDICATIONS: MAC sedation, administered by CRNA and Propofol (Diprivan) 180 mg IV  DESCRIPTION OF PROCEDURE:   After the risks and benefits and of the procedure were explained, informed consent was obtained.  A digital rectal exam revealed no abnormalities of the rectum.    The LB [id number]  endoscope was introduced through the anus and advanced to the cecum, which was identified by both the appendix and ileocecal valve .  The quality of the prep was good, using MoviPrep .  The instrument was then slowly withdrawn as the colon was fully examined.     COLON FINDINGS: Two smooth sessile polyps ranging between 3-62mm in size were found in the sigmoid colon.  A polypectomy was performed with cold forceps.  The resection was complete and the polyp tissue was completely retrieved.     Retroflexed views revealed no abnormalities.     The scope was then withdrawn from the patient and the procedure completed.  COMPLICATIONS: There were no complications. ENDOSCOPIC IMPRESSION: Two sessile polyps ranging between 3-44mm in size were found in the sigmoid colon; polypectomy was performed with cold forceps  RECOMMENDATIONS: 1.  Await pathology results 2.  High fiber diet   REPEAT EXAM: In 5 year(s)  for Colonoscopy.  cc:  _______________________________ eSignedHart Carwin, MD 07/11/2012 9:08 AM     PATIENT NAME:  Bryce Hurst, Bryce Hurst MR#: [id number]

## 2012-07-11 NOTE — Progress Notes (Signed)
Called to room to assist during endoscopic procedure.  Patient ID and intended procedure confirmed with present staff. Received instructions for my participation in the procedure from the performing physician.  

## 2012-07-14 ENCOUNTER — Telehealth: Payer: Self-pay

## 2012-07-14 NOTE — Telephone Encounter (Signed)
  Follow up Call-  Call back number 07/11/2012  Post procedure Call Back phone  # [phone]  Permission to leave phone message Yes     Patient questions:  Do you have a fever, pain , or abdominal swelling? no Pain Score  0 *  Have you tolerated food without any problems? yes  Have you been able to return to your normal activities? yes  Do you have any questions about your discharge instructions: Diet   no Medications  no Follow up visit  no  Do you have questions or concerns about your Care? no  Actions: * If pain score is 4 or above: No action needed, pain <4.

## 2012-07-16 ENCOUNTER — Encounter: Payer: Self-pay | Admitting: Internal Medicine

## 2012-10-01 ENCOUNTER — Encounter: Payer: Self-pay | Admitting: Internal Medicine

## 2012-10-01 ENCOUNTER — Ambulatory Visit: Payer: BC Managed Care – PPO

## 2012-10-01 ENCOUNTER — Ambulatory Visit (INDEPENDENT_AMBULATORY_CARE_PROVIDER_SITE_OTHER): Payer: BC Managed Care – PPO | Admitting: Internal Medicine

## 2012-10-01 DIAGNOSIS — R079 Chest pain, unspecified: Secondary | ICD-10-CM

## 2012-10-01 LAB — D-DIMER, QUANTITATIVE: D-Dimer, Quant: 0.27 ug/mL-FEU (ref 0.00–0.48)

## 2012-10-01 MED ORDER — CLONAZEPAM 0.5 MG PO TABS: ORAL_TABLET | ORAL | Status: DC

## 2012-10-01 NOTE — Patient Instructions (Addendum)
To prevent sleep dysfunction follow these instructions for sleep hygiene. Do not read, watch TV, or eat in bed. Do not get into bed until you are ready to turn off the light &  to go to sleep. Do not ingest stimulants ( decongestants, diet pills, nicotine, caffeine) after the evening meal.

## 2012-10-01 NOTE — Progress Notes (Signed)
Subjective:    Patient ID: Bryce Hurst, male    DOB: Jul 15, 1950, 62 y.o.   MRN: [id number]  HPI  Chest pain began Monday 09/29/12 approximately 1 PM while sitting at his computer. There was no injury or trauma prior to the onset of symptoms. He denies any recent immobilization or prolonged travel.  The pain is sharp and described up as up to a level VIII on a scale of 10. The pain does last few seconds. It starts in the left chest and can radiate to the right chest. It does not radiate to the jaw, back,or  arm.  He has had on average 4-5 episodes a day since Monday. He believes that the symptoms are job stress related.  He's had no associated nausea, sweating, dyspnea, or palpitations.  He is found no definite interventions which exacerbate or improve the chest pain  His brother had ulcer disease. His mother had heart attack at 44. Father had a history of chest pain in his late 36s ; "weak heart " diagnosed, ? CM. He died of CHF. His sister had pulmonary thromboemboli  He's never smoked.  He denies any change in appetite. He has had some sleep issues. He will sleep at lunch at times.  Review of Systems  He denies any exertional chest pain;dyspepsia;dysphagia;ankle edema; claudication; exertional dyspnea;PND; or calf pain.  There's been no associated rash or change in color or temperature of skin in the area of the pain.  He denies any back pain with radicular characteristics.  He denies a constellation of headache, chest pain, flushing, and diarrhea.  He typically swims once a week; such exercise is not associated with chest pain.     Objective:   Physical Exam Gen.: Healthy and well-nourished in appearance. Alert, appropriate and cooperative throughout exam. Eyes: No corneal or conjunctival inflammation noted. No icterus Nose: External nasal exam reveals no deformity or inflammation. Nasal mucosa are pink and moist. No lesions or exudates noted.   Mouth: Oral mucosa and  oropharynx reveal no lesions or exudates. Teeth in good repair. Neck: No deformities, masses, or tenderness noted.  Thyroid normal. Lungs: Normal respiratory effort; chest expands symmetrically. Lungs are clear to auscultation without rales, wheezes, or increased work of breathing. Heart: Normal rate and rhythm. Normal S1 and S2. No gallop, click, or rub. S4 w/o murmur. Abdomen: Bowel sounds normal; abdomen soft and nontender. No masses, organomegaly or hernias noted.                               Musculoskeletal/extremities:  No clubbing, cyanosis, edema, or significant extremity  deformity noted. Range of motion normal .Tone & strength  Normal. Joints normal . Nail health good. Able to lie down & sit up w/o help. Negative SLR bilaterally. Negative Homan's Vascular: Carotid, radial artery, dorsalis pedis and  posterior tibial pulses are full and equal. No bruits present. Neurologic: Alert and oriented x3. Deep tendon reflexes symmetrical and normal.         Skin: Intact without suspicious lesions or rashes. Lipoma L upper abdomen Lymph: No cervical, axillary lymphadenopathy present. Psych: Mood and affect are normal. Normally interactive  Assessment & Plan:  #1 atypical chest pain in the context of exogenous stress  Plan: Troponin and d-dimer collected. Neurotransmitter deficiency pathophysiology discussed. He does not want to take any medication distress at this time.

## 2012-11-24 ENCOUNTER — Other Ambulatory Visit: Payer: Self-pay | Admitting: Internal Medicine

## 2012-11-24 NOTE — Telephone Encounter (Signed)
Benazepril refill sent to pharmacy 

## 2012-12-18 ENCOUNTER — Other Ambulatory Visit: Payer: Self-pay

## 2013-03-08 ENCOUNTER — Other Ambulatory Visit: Payer: Self-pay | Admitting: Internal Medicine

## 2013-03-09 NOTE — Telephone Encounter (Signed)
Benazepril refilled per protocol. JG//CMA 

## 2013-12-09 ENCOUNTER — Other Ambulatory Visit (INDEPENDENT_AMBULATORY_CARE_PROVIDER_SITE_OTHER): Payer: BC Managed Care – PPO

## 2013-12-09 ENCOUNTER — Ambulatory Visit (INDEPENDENT_AMBULATORY_CARE_PROVIDER_SITE_OTHER): Payer: BC Managed Care – PPO | Admitting: Internal Medicine

## 2013-12-09 ENCOUNTER — Other Ambulatory Visit: Payer: Self-pay | Admitting: Internal Medicine

## 2013-12-09 ENCOUNTER — Encounter: Payer: Self-pay | Admitting: Internal Medicine

## 2013-12-09 VITALS — BP 120/84 | HR 91 | Temp 98.1°F | Resp 12 | Ht 68.0 in | Wt 212.5 lb

## 2013-12-09 DIAGNOSIS — Z0000 Encounter for general adult medical examination without abnormal findings: Secondary | ICD-10-CM

## 2013-12-09 DIAGNOSIS — Z0189 Encounter for other specified special examinations: Secondary | ICD-10-CM

## 2013-12-09 DIAGNOSIS — Z86010 Personal history of colonic polyps: Secondary | ICD-10-CM

## 2013-12-09 DIAGNOSIS — N528 Other male erectile dysfunction: Secondary | ICD-10-CM

## 2013-12-09 DIAGNOSIS — Z23 Encounter for immunization: Secondary | ICD-10-CM

## 2013-12-09 DIAGNOSIS — E785 Hyperlipidemia, unspecified: Secondary | ICD-10-CM

## 2013-12-09 DIAGNOSIS — N529 Male erectile dysfunction, unspecified: Secondary | ICD-10-CM | POA: Insufficient documentation

## 2013-12-09 LAB — HEPATIC FUNCTION PANEL
ALT: 25 U/L (ref 0–53)
AST: 22 U/L (ref 0–37)
Albumin: 4 g/dL (ref 3.5–5.2)
Alkaline Phosphatase: 67 U/L (ref 39–117)
BILIRUBIN DIRECT: 0.1 mg/dL (ref 0.0–0.3)
BILIRUBIN TOTAL: 1.1 mg/dL (ref 0.2–1.2)
Total Protein: 7.6 g/dL (ref 6.0–8.3)

## 2013-12-09 LAB — CBC WITH DIFFERENTIAL/PLATELET
BASOS PCT: 0.2 % (ref 0.0–3.0)
Basophils Absolute: 0 10*3/uL (ref 0.0–0.1)
Eosinophils Absolute: 0.1 10*3/uL (ref 0.0–0.7)
Eosinophils Relative: 1.9 % (ref 0.0–5.0)
HCT: 48 % (ref 39.0–52.0)
HEMOGLOBIN: 16 g/dL (ref 13.0–17.0)
LYMPHS PCT: 20.4 % (ref 12.0–46.0)
Lymphs Abs: 1 10*3/uL (ref 0.7–4.0)
MCHC: 33.3 g/dL (ref 30.0–36.0)
MCV: 90.4 fl (ref 78.0–100.0)
Monocytes Absolute: 0.8 10*3/uL (ref 0.1–1.0)
Monocytes Relative: 16.7 % — ABNORMAL HIGH (ref 3.0–12.0)
NEUTROS ABS: 2.9 10*3/uL (ref 1.4–7.7)
Neutrophils Relative %: 60.8 % (ref 43.0–77.0)
Platelets: 137 10*3/uL — ABNORMAL LOW (ref 150.0–400.0)
RBC: 5.32 Mil/uL (ref 4.22–5.81)
RDW: 13.4 % (ref 11.5–15.5)
WBC: 4.7 10*3/uL (ref 4.0–10.5)

## 2013-12-09 LAB — BASIC METABOLIC PANEL
BUN: 18 mg/dL (ref 6–23)
CHLORIDE: 103 meq/L (ref 96–112)
CO2: 22 meq/L (ref 19–32)
Calcium: 9.4 mg/dL (ref 8.4–10.5)
Creatinine, Ser: 1.3 mg/dL (ref 0.4–1.5)
GFR: 59.77 mL/min — ABNORMAL LOW (ref >60.00)
Glucose, Bld: 111 mg/dL — ABNORMAL HIGH (ref 70–99)
Potassium: 3.9 mEq/L (ref 3.5–5.1)
SODIUM: 138 meq/L (ref 135–145)

## 2013-12-09 LAB — TSH: TSH: 1.47 u[IU]/mL (ref 0.35–4.50)

## 2013-12-09 LAB — HEMOGLOBIN A1C: Hgb A1c MFr Bld: 7.2 % — ABNORMAL HIGH (ref 4.6–6.5)

## 2013-12-09 MED ORDER — SILDENAFIL CITRATE 20 MG PO TABS: ORAL_TABLET | ORAL | Status: DC

## 2013-12-09 NOTE — Progress Notes (Signed)
Pre visit review using our clinic review tool, if applicable. No additional management support is needed unless otherwise documented below in the visit note. 

## 2013-12-09 NOTE — Patient Instructions (Signed)

## 2013-12-09 NOTE — Progress Notes (Signed)
Subjective:    Patient ID: Bryce Hurst, male    DOB: [birth year], 63 y.o.   MRN: [id number]  HPI  He is here for a physical;acute issues include burning in his toes particularly warm shower. He also notices this when stressed.  He is on a modified heart healthy diet as well as low sodium for the most part. He walks 3-4 times per week for 30 minutes without cardiopulmonary symptoms. BP not monitored @ home.  The family history is positive for premature heart attack in his mother and borderline premature for stroke in a sister  Advanced cholesterol testing reveals his LDL goal is less than 115, ideally less than 85.  No statin to date.    Review of Systems    Chest pain, palpitations, tachycardia, exertional dyspnea, paroxysmal nocturnal dyspnea, claudication or edema are absent.  Medication requested for ED symptoms.       Objective:   Physical Exam Gen.: Healthy and well-nourished in appearance. Alert, appropriate and cooperative throughout exam. Appears younger than stated age  Head: Normocephalic without obvious abnormalities;  beard & moustache.Minor pattern alopecia  Eyes: No corneal or conjunctival inflammation noted. Pupils equal round reactive to light and accommodation. Extraocular motion intact.  Ears: External  ear exam reveals no significant lesions or deformities. Wax on R; L ear WNL. Hearing is grossly normal bilaterally. Nose: External nasal exam reveals no deformity or inflammation. Nasal mucosa are pink and moist. No lesions or exudates noted.   Mouth: Oral mucosa and oropharynx reveal no lesions or exudates. Teeth in good repair. Neck: No deformities, masses, or tenderness noted. Range of motion &  Thyroid normal Lungs: Normal respiratory effort; chest expands symmetrically. Lungs are clear to auscultation without rales, wheezes, or increased work of breathing. Heart: Normal rate and rhythm. Normal S1 and S2. No gallop, click, or rub.S4 w/o murmur. Abdomen:  Bowel sounds normal; abdomen soft and nontender. No masses, organomegaly or hernias noted. Genitalia: Genitalia normal except for left varices. Prostate is normal without enlargement, asymmetry, nodularity, or induration                                Musculoskeletal/extremities: No deformity or scoliosis noted of  the thoracic or lumbar spine.  No clubbing, cyanosis, edema, or significant extremity  deformity noted. Range of motion normal .Tone & strength normal. Hand joints normal Fingernail / toenail health good. Able to lie down & sit up w/o help. Negative SLR bilaterally Vascular: Carotid, radial artery, dorsalis pedis and  posterior tibial pulses are equal.  Slightly decreased DPP.No bruits present. Neurologic: Alert and oriented x3. Deep tendon reflexes symmetrical and normal.  Light touch normal over feet. Gait normal .     Skin: Intact without suspicious lesions or rashes. Lymph: No cervical, axillary, or inguinal lymphadenopathy present. Psych: Mood and affect are normal. Normally interactive                                                                                        Assessment & Plan:  #1 comprehensive physical exam; no acute findings #2 BLE paresthesias  in shower & with stress. Gabapentin proposed if progressive. Feet protection from heat discused  Plan: see Orders  & Recommendations

## 2013-12-11 LAB — NMR LIPOPROFILE WITH LIPIDS
CHOLESTEROL, TOTAL: 184 mg/dL (ref 100–199)
HDL PARTICLE NUMBER: 29 umol/L — AB (ref >=30.5)
HDL Size: 8.5 nm — ABNORMAL LOW (ref >=9.2)
HDL-C: 47 mg/dL (ref >39)
LDL (calc): 118 mg/dL — ABNORMAL HIGH (ref 0–99)
LDL Particle Number: 1425 nmol/L — ABNORMAL HIGH (ref <1000)
LDL Size: 20.9 nm (ref >=20.8)
LP-IR Score: 45 (ref <=45)
Large HDL-P: 1.8 umol/L — ABNORMAL LOW (ref >=4.8)
Small LDL Particle Number: 599 nmol/L — ABNORMAL HIGH (ref <=527)
TRIGLYCERIDES: 96 mg/dL (ref 0–149)
VLDL Size: 39.4 nm (ref <=46.6)

## 2014-01-20 ENCOUNTER — Telehealth: Payer: Self-pay | Admitting: Internal Medicine

## 2014-01-20 MED ORDER — BENAZEPRIL HCL 40 MG PO TABS: 40.0000 mg | ORAL_TABLET | Freq: Every day | ORAL | Status: DC

## 2014-01-20 NOTE — Telephone Encounter (Signed)
Pt called in and is requesting refill on benazepril (LOTENSIN) 40 MG tablet [[id number]]

## 2014-01-20 NOTE — Telephone Encounter (Signed)
Called pt no answer LMOM (cell) refill sent to [facility]...Bryce Hurst

## 2014-03-29 ENCOUNTER — Encounter: Payer: Self-pay | Admitting: Cardiovascular Disease

## 2015-05-05 ENCOUNTER — Encounter: Payer: Self-pay | Admitting: Internal Medicine

## 2015-05-05 DIAGNOSIS — E118 Type 2 diabetes mellitus with unspecified complications: Secondary | ICD-10-CM | POA: Insufficient documentation

## 2015-05-05 DIAGNOSIS — E119 Type 2 diabetes mellitus without complications: Secondary | ICD-10-CM

## 2015-05-05 NOTE — Progress Notes (Signed)
    Subjective:    Patient ID: Bryce Hurst, male    DOB: 21-Nov-1950, 65 y.o.   MRN: [id number]  HPI Error   - no show  Patient Active Problem List   Diagnosis Date Noted  . Erectile dysfunction 12/09/2013  . FASTING HYPERGLYCEMIA 09/23/2008  . HYPERTENSION 02/27/2008  . HYPERPLASIA PROSTATE UNS W/O UR OBST & OTH LUTS 02/27/2008  . History of colonic polyps 02/27/2008  . Hyperlipidemia 09/26/2007    Current Outpatient Prescriptions on File Prior to Visit  Medication Sig Dispense Refill  . benazepril (LOTENSIN) 40 MG tablet Take 1 tablet (40 mg total) by mouth daily. 90 tablet 3  . sildenafil (REVATIO) 20 MG tablet 2-5 qd prn 50 tablet 1   No current facility-administered medications on file prior to visit.    Past Medical History  Diagnosis Date  . Hyperlipidemia   . Hypertension   . Acute meniscal tear of knee     right  . Cataract of both eyes     Past Surgical History  Procedure Laterality Date  . Colonoscopy w/ polypectomy  1996 & 2014    negative 2003 (was due 2006-2008)  . Mouth surgery      maxillary bone graft  ([facility]GE[facility] OFFICE)  . Knee arthroscopy with medial menisectomy Right 04/25/2012    ct  . Cataract extraction, bilateral    . Cataract extraction, bilateral Bilateral 05/27/2012  . Orthopedic surgery Right 05/10/2012    Social History   Social History  . Marital Status: Married    Spouse Name: N/A  . Number of Children: N/A  . Years of Education: N/A   Social History Main Topics  . Smoking status: Never Smoker   . Smokeless tobacco: Never Used  . Alcohol Use: No  . Drug Use: No  . Sexual Activity: Not on file   Other Topics Concern  . Not on file   Social History Narrative    Family History  Problem Relation Age of Onset  . Diabetes Mother   . Hypertension Mother   . Heart disease Mother     MI X4 , initially @ 24  . Stroke Mother 68  . Cancer Father     colon & lung  . Colon cancer Father   . Hypertension Brother   . Colon  cancer Brother     lung mets  . Breast cancer Paternal Aunt   . Prostate cancer Paternal Uncle   . Esophageal cancer Neg Hx   . Stomach cancer Neg Hx   . Rectal cancer Neg Hx   . Hypertension Sister   . Cancer Maternal Aunt   . Breast cancer Maternal Aunt   . Pulmonary embolism Sister   . Congestive Heart Failure Father     ? CM  . Transient ischemic attack Sister 68    Review of Systems     Objective:  There were no vitals filed for this visit. There were no vitals filed for this visit. There is no weight on file to calculate BMI.   Physical Exam         Assessment & Plan:        This encounter was created in error - please disregard.

## 2017-12-30 ENCOUNTER — Other Ambulatory Visit: Payer: Self-pay | Admitting: Internal Medicine

## 2017-12-30 ENCOUNTER — Ambulatory Visit
Admission: RE | Admit: 2017-12-30 | Discharge: 2017-12-30 | Disposition: A | Discharge status: Home / Self Care | Payer: BLUE CROSS/BLUE SHIELD | Source: Ambulatory Visit | Attending: Internal Medicine | Admitting: Internal Medicine

## 2017-12-30 DIAGNOSIS — M25532 Pain in left wrist: Secondary | ICD-10-CM

## 2018-02-25 ENCOUNTER — Other Ambulatory Visit: Payer: Self-pay | Admitting: Internal Medicine

## 2018-02-25 ENCOUNTER — Ambulatory Visit
Admission: RE | Admit: 2018-02-25 | Discharge: 2018-02-25 | Disposition: A | Discharge status: Home / Self Care | Payer: Commercial Managed Care - PPO | Source: Ambulatory Visit | Attending: Internal Medicine | Admitting: Internal Medicine

## 2018-02-25 DIAGNOSIS — T148XXA Other injury of unspecified body region, initial encounter: Secondary | ICD-10-CM

## 2018-06-08 ENCOUNTER — Encounter: Payer: Self-pay | Admitting: Internal Medicine

## 2018-09-25 ENCOUNTER — Encounter: Payer: Self-pay | Admitting: Cardiology

## 2018-09-26 ENCOUNTER — Ambulatory Visit (INDEPENDENT_AMBULATORY_CARE_PROVIDER_SITE_OTHER): Payer: Commercial Managed Care - PPO | Admitting: Cardiology

## 2018-09-26 ENCOUNTER — Encounter: Payer: Self-pay | Admitting: Cardiology

## 2018-09-26 ENCOUNTER — Other Ambulatory Visit: Payer: Self-pay

## 2018-09-26 VITALS — BP 127/85 | HR 73 | Temp 98.9°F | Ht 68.0 in | Wt 208.4 lb

## 2018-09-26 DIAGNOSIS — I10 Essential (primary) hypertension: Secondary | ICD-10-CM

## 2018-09-26 DIAGNOSIS — E118 Type 2 diabetes mellitus with unspecified complications: Secondary | ICD-10-CM | POA: Diagnosis not present

## 2018-09-26 DIAGNOSIS — R0609 Other forms of dyspnea: Secondary | ICD-10-CM

## 2018-09-26 DIAGNOSIS — E782 Mixed hyperlipidemia: Secondary | ICD-10-CM

## 2018-09-26 DIAGNOSIS — I209 Angina pectoris, unspecified: Secondary | ICD-10-CM

## 2018-09-26 NOTE — Progress Notes (Signed)
Patient referred by Audley Hose, MD for left arm numbness on exertion.  Subjective:   Bryce Hurst, male    DOB: 12/13/1950, 68 y.o.   MRN: [id number]   Chief Complaint  Patient presents with  . Arm numbness    Exertional   HPI  68 y.o. African American male with hypertension, hyperlipidemia, type 2 DM, mild CKD.  Patient works for [employer] and has been very busy lately.  He noticed that few weeks ago, he experienced chest pain associated with emotional stress at work.  More recently, he has noticed numbness in his left arm during physical exertion.  He walks for 3 miles on a daily basis over 45 minutes.  He notices that into his second mile or so, he started experiencing numbness in his left arm.  It seems to improve with certain movements of his arm without resting.  These symptoms never occur at rest. He denies any shortness of breath.  His blood pressure is well controlled with amlodipine 5 mg daily.  He was previously on benazepril 40 mg daily.  He is unclear if his PCP asked him to stop this medication.  His lipid panel is controlled on Lipitor 10 mg daily.  He was previously on aspirin, but was discontinued.  He denies any bleeding issues.   Past Medical History:  Diagnosis Date  . Acute meniscal tear of knee    right  . Cataract of both eyes   . Hyperlipidemia   . Hypertension      Past Surgical History:  Procedure Laterality Date  . CATARACT EXTRACTION, BILATERAL    . CATARACT EXTRACTION, BILATERAL Bilateral 05/27/2012  . [place] 2014   negative 2003 (was due 2006-2008)  . KNEE ARTHROSCOPY WITH MEDIAL MENISECTOMY Right 04/25/2012   ct  . MOUTH SURGERY     maxillary bone graft  ([facility])  . ORTHOPEDIC SURGERY Right 05/10/2012     Social History   Socioeconomic History  . Marital status: Married    Spouse name: Not on file  . Number of children: 3  . Years of education: Not on file  .  Highest education level: Not on file  Occupational History  . Not on file  Social Needs  . Financial resource strain: Not on file  . Food insecurity    Worry: Not on file    Inability: Not on file  . Transportation needs    Medical: Not on file    Non-medical: Not on file  Tobacco Use  . Smoking status: Never Smoker  . Smokeless tobacco: Never Used  Substance and Sexual Activity  . Alcohol use: No  . Drug use: No  . Sexual activity: Not on file  Lifestyle  . Physical activity    Days per week: Not on file    Minutes per session: Not on file  . Stress: Not on file  Relationships  . Social Herbalist on phone: Not on file    Gets together: Not on file    Attends religious service: Not on file    Active member of club or organization: Not on file    Attends meetings of clubs or organizations: Not on file    Relationship status: Not on file  . Intimate partner violence    Fear of current or ex partner: Not on file    Emotionally abused: Not on file    Physically abused: Not on file  Forced sexual activity: Not on file  Other Topics Concern  . Not on file  Social History Narrative  . Not on file     Family History  Problem Relation Age of Onset  . Diabetes Mother   . Hypertension Mother   . Heart disease Mother        MI X4 , initially @ 47  . Stroke Mother 35  . Cancer Father        colon & lung  . Colon cancer Father   . Congestive Heart Failure Father        ? CM  . Hypertension Brother   . Colon cancer Brother        lung mets  . Breast cancer Paternal Aunt   . Prostate cancer Paternal Uncle   . Hypertension Sister   . Cancer Maternal Aunt   . Breast cancer Maternal Aunt   . Pulmonary embolism Sister   . Transient ischemic attack Sister 10  . Esophageal cancer Neg Hx   . Stomach cancer Neg Hx   . Rectal cancer Neg Hx      Current Outpatient Medications on File Prior to Visit  Medication Sig Dispense Refill  . BAYER CONTOUR TEST test  strip   0  . benazepril (LOTENSIN) 40 MG tablet Take 1 tablet (40 mg total) by mouth daily. 90 tablet 3  . metFORMIN (GLUCOPHAGE-XR) 500 MG 24 hr tablet   0  . MICROLET LANCETS MISC   0  . sildenafil (REVATIO) 20 MG tablet 2-5 qd prn 50 tablet 1   No current facility-administered medications on file prior to visit.     Cardiovascular studies:  EKG 09/26/2018: Sinus rhythm 82 bpm. Early repolarization changes.   Recent labs: 06/09/2018: Glucose 131. BUN/Cr 20/1.35. eGFR 62. Na/K 138/4.5. Albumin 5.0 (3.8-4.8) H/H 14/42. MCV 92. Platelets 138 Chol 127, TG 62, HDL 50, LDL 65.  HbA1C 7.0%   Review of Systems  Constitution: Negative for decreased appetite, malaise/fatigue, weight gain and weight loss.  HENT: Negative for congestion.   Eyes: Negative for visual disturbance.  Cardiovascular: Negative for chest pain, dyspnea on exertion, leg swelling, palpitations and syncope.  Respiratory: Negative for cough.   Endocrine: Negative for cold intolerance.  Hematologic/Lymphatic: Does not bruise/bleed easily.  Skin: Negative for itching and rash.  Musculoskeletal: Negative for myalgias.  Gastrointestinal: Negative for abdominal pain, nausea and vomiting.  Genitourinary: Negative for dysuria.  Neurological: Positive for numbness (Left arm numbness on exertion). Negative for dizziness and weakness.  Psychiatric/Behavioral: The patient is not nervous/anxious.   All other systems reviewed and are negative.       Vitals:   09/26/18 1421 09/26/18 1455  BP: (!) 150/89 127/85  Pulse: 85 73  Temp: 98.9 F (37.2 C)   SpO2: 94%      Body mass index is 31.69 kg/m. Filed Weights   09/26/18 1421  Weight: 208 lb 6.4 oz (94.5 kg)     Objective:   Physical Exam  Constitutional: He is oriented to person, place, and time. He appears well-developed and well-nourished. No distress.  HENT:  Head: Normocephalic and atraumatic.  Eyes: Pupils are equal, round, and reactive to light.  Conjunctivae are normal.  Neck: No JVD present.  Cardiovascular: Normal rate, regular rhythm and intact distal pulses.  Pulmonary/Chest: Effort normal and breath sounds normal. He has no wheezes. He has no rales.  Abdominal: Soft. Bowel sounds are normal. There is no rebound.  Musculoskeletal:  General: No edema.  Lymphadenopathy:    He has no cervical adenopathy.  Neurological: He is alert and oriented to person, place, and time. No cranial nerve deficit.  Skin: Skin is warm and dry.  Psychiatric: He has a normal mood and affect.  Nursing note and vitals reviewed.         Assessment & Recommendations:   68 y.o. African American male with hypertension, hyperlipidemia, type 2 DM, mild CKD.  Left arm numbness: Only on exertion. Possible angina equivalent. Will obtain exercise nuclear stress test.  Hypertension: Fairly well controlled on amlodipine 5 mg daily. I encouraged him to discuss with Dr. Maia Petties regarding ACEi.  Continue management of DM and hyperlipidemia, as per Dr. Maia Petties.  Thank you for referring the patient to [place]. Please feel free to contact with any questions.  Nigel Mormon, MD [facility] Pager: [phone] Office: [phone] If no answer Cell [phone]

## 2018-11-03 ENCOUNTER — Ambulatory Visit (INDEPENDENT_AMBULATORY_CARE_PROVIDER_SITE_OTHER): Payer: Commercial Managed Care - PPO

## 2018-11-03 ENCOUNTER — Other Ambulatory Visit: Payer: Self-pay

## 2018-11-03 DIAGNOSIS — I209 Angina pectoris, unspecified: Secondary | ICD-10-CM | POA: Diagnosis not present

## 2018-11-03 NOTE — Progress Notes (Signed)
Normal stress test. I do not thin his arm numbness is related to cardiac reasons.  Please let the patient know the results. Okay with canceling follow up appt, unless patient has more questions. Please let front desk know.  Thanks MJP

## 2018-11-03 NOTE — Progress Notes (Signed)
Pt aware please cancel his upcoming appt

## 2018-11-12 ENCOUNTER — Telehealth: Payer: Commercial Managed Care - PPO | Admitting: Cardiology

## 2019-03-07 ENCOUNTER — Other Ambulatory Visit: Payer: Self-pay | Admitting: Unknown Physician Specialty

## 2019-03-07 ENCOUNTER — Telehealth: Payer: Self-pay | Admitting: Unknown Physician Specialty

## 2019-03-07 DIAGNOSIS — U071 COVID-19: Secondary | ICD-10-CM

## 2019-03-07 DIAGNOSIS — I10 Essential (primary) hypertension: Secondary | ICD-10-CM

## 2019-03-07 NOTE — Telephone Encounter (Signed)
  I connected by phone with Bryce Hurst on 03/07/2019 at 4:06 PM to discuss the potential use of an new treatment for mild to moderate COVID-19 viral infection in non-hospitalized patients.  This patient is a 69 y.o. male that meets the FDA criteria for Emergency Use Authorization of bamlanivimab or casirivimab\imdevimab.  Has a (+) direct SARS-CoV-2 viral test result  Has mild or moderate COVID-19   Is ? 69 years of age and weighs ? 40 kg  Is NOT hospitalized due to COVID-19  Is NOT requiring oxygen therapy or requiring an increase in baseline oxygen flow rate due to COVID-19  Is within 10 days of symptom onset  Has at least one of the high risk factor(s) for progression to severe COVID-19 and/or hospitalization as defined in EUA.  Specific high risk criteria : >/= 69 yo and hypertension   I have spoken and communicated the following to the patient or parent/caregiver:  1. FDA has authorized the emergency use of bamlanivimab and casirivimab\imdevimab for the treatment of mild to moderate COVID-19 in adults and pediatric patients with positive results of direct SARS-CoV-2 viral testing who are 14 years of age and older weighing at least 40 kg, and who are at high risk for progressing to severe COVID-19 and/or hospitalization.  2. The significant known and potential risks and benefits of bamlanivimab and casirivimab\imdevimab, and the extent to which such potential risks and benefits are unknown.  3. Information on available alternative treatments and the risks and benefits of those alternatives, including clinical trials.  4. Patients treated with bamlanivimab and casirivimab\imdevimab should continue to self-isolate and use infection control measures (e.g., wear mask, isolate, social distance, avoid sharing personal items, clean and disinfect "high touch" surfaces, and frequent handwashing) according to CDC guidelines.   5. The patient or parent/caregiver has the option to accept or  refuse bamlanivimab or casirivimab\imdevimab .  After reviewing this information with the patient, The patient agreed to proceed with receiving the casirivimab\imdevimab infusion and will be provided a copy of the Fact sheet prior to receiving the infusion.   Kathrine Haddock 03/07/2019 4:06 PM  Sx onset 1/20- sore throat and body aches

## 2019-03-11 ENCOUNTER — Ambulatory Visit (HOSPITAL_COMMUNITY)
Admission: RE | Admit: 2019-03-11 | Discharge: 2019-03-11 | Disposition: A | Discharge status: Home / Self Care | Payer: Commercial Managed Care - PPO | Source: Ambulatory Visit | Attending: Pulmonary Disease | Admitting: Pulmonary Disease

## 2019-03-11 ENCOUNTER — Encounter (HOSPITAL_COMMUNITY): Payer: Self-pay

## 2019-03-11 DIAGNOSIS — U071 COVID-19: Secondary | ICD-10-CM | POA: Insufficient documentation

## 2019-03-11 DIAGNOSIS — I10 Essential (primary) hypertension: Secondary | ICD-10-CM | POA: Diagnosis present

## 2019-03-11 MED ORDER — FAMOTIDINE IN NACL 20-0.9 MG/50ML-% IV SOLN: 20.0000 mg | Freq: Once | INTRAVENOUS | Status: DC | PRN

## 2019-03-11 MED ORDER — DIPHENHYDRAMINE HCL 50 MG/ML IJ SOLN: 50.0000 mg | Freq: Once | INTRAMUSCULAR | Status: DC | PRN

## 2019-03-11 MED ORDER — METHYLPREDNISOLONE SODIUM SUCC 125 MG IJ SOLR: 125.0000 mg | Freq: Once | INTRAMUSCULAR | Status: DC | PRN

## 2019-03-11 MED ORDER — EPINEPHRINE 0.3 MG/0.3ML IJ SOAJ: 0.3000 mg | Freq: Once | INTRAMUSCULAR | Status: DC | PRN

## 2019-03-11 MED ORDER — ALBUTEROL SULFATE HFA 108 (90 BASE) MCG/ACT IN AERS: 2.0000 | INHALATION_SPRAY | Freq: Once | RESPIRATORY_TRACT | Status: DC | PRN

## 2019-03-11 MED ORDER — SODIUM CHLORIDE 0.9 % IV SOLN
INTRAVENOUS | Status: DC | PRN
  Administered 2019-03-11: 13:00:00 250 mL via INTRAVENOUS

## 2019-03-11 MED ORDER — SODIUM CHLORIDE 0.9 % IV SOLN
Freq: Once | INTRAVENOUS | Status: AC
  Filled 2019-03-11: qty 10

## 2019-03-11 NOTE — Discharge Instructions (Signed)
10 Things You Can Do to Manage Your COVID-19 Symptoms at Home If you have possible or confirmed COVID-19: 1. Stay home from work and school. And stay away from other public places. If you must go out, avoid using any kind of public transportation, ridesharing, or taxis. 2. Monitor your symptoms carefully. If your symptoms get worse, call your healthcare provider immediately. 3. Get rest and stay hydrated. 4. If you have a medical appointment, call the healthcare provider ahead of time and tell them that you have or may have COVID-19. 5. For medical emergencies, call 911 and notify the dispatch personnel that you have or may have COVID-19. 6. Cover your cough and sneezes with a tissue or use the inside of your elbow. 7. Wash your hands often with soap and water for at least 20 seconds or clean your hands with an alcohol-based hand sanitizer that contains at least 60% alcohol. 8. As much as possible, stay in a specific room and away from other people in your home. Also, you should use a separate bathroom, if available. If you need to be around other people in or outside of the home, wear a mask. 9. Avoid sharing personal items with other people in your household, like dishes, towels, and bedding. 10. Clean all surfaces that are touched often, like counters, tabletops, and doorknobs. Use household cleaning sprays or wipes according to the label instructions. cdc.gov/coronavirus 08/13/2018 This information is not intended to replace advice given to you by your health care provider. Make sure you discuss any questions you have with your health care provider. Document Revised: 01/15/2019 Document Reviewed: 01/15/2019 Elsevier Patient Education  2020 Elsevier Inc.  COVID-19: How to Protect Yourself and Others Know how it spreads  There is currently no vaccine to prevent coronavirus disease 2019 (COVID-19).  The best way to prevent illness is to avoid being exposed to this virus.  The virus is  thought to spread mainly from person-to-person. ? Between people who are in close contact with one another (within about 6 feet). ? Through respiratory droplets produced when an infected person coughs, sneezes or talks. ? These droplets can land in the mouths or noses of people who are nearby or possibly be inhaled into the lungs. ? COVID-19 may be spread by people who are not showing symptoms. Everyone should Clean your hands often  Wash your hands often with soap and water for at least 20 seconds especially after you have been in a public place, or after blowing your nose, coughing, or sneezing.  If soap and water are not readily available, use a hand sanitizer that contains at least 60% alcohol. Cover all surfaces of your hands and rub them together until they feel dry.  Avoid touching your eyes, nose, and mouth with unwashed hands. Avoid close contact  Limit contact with others as much as possible.  Avoid close contact with people who are sick.  Put distance between yourself and other people. ? Remember that some people without symptoms may be able to spread virus. ? This is especially important for people who are at higher risk of getting very sick.www.cdc.gov/coronavirus/2019-ncov/need-extra-precautions/people-at-higher-risk.html Cover your mouth and nose with a mask when around others  You could spread COVID-19 to others even if you do not feel sick.  Everyone should wear a mask in public settings and when around people not living in their household, especially when social distancing is difficult to maintain. ? Masks should not be placed on young children under age 2, anyone who   has trouble breathing, or is unconscious, incapacitated or otherwise unable to remove the mask without assistance.  The mask is meant to protect other people in case you are infected.  Do NOT use a facemask meant for a healthcare worker.  Continue to keep about 6 feet between yourself and others. The  mask is not a substitute for social distancing. Cover coughs and sneezes  Always cover your mouth and nose with a tissue when you cough or sneeze or use the inside of your elbow.  Throw used tissues in the trash.  Immediately wash your hands with soap and water for at least 20 seconds. If soap and water are not readily available, clean your hands with a hand sanitizer that contains at least 60% alcohol. Clean and disinfect  Clean AND disinfect frequently touched surfaces daily. This includes tables, doorknobs, light switches, countertops, handles, desks, phones, keyboards, toilets, faucets, and sinks. www.cdc.gov/coronavirus/2019-ncov/prevent-getting-sick/disinfecting-your-home.html  If surfaces are dirty, clean them: Use detergent or soap and water prior to disinfection.  Then, use a household disinfectant. You can see a list of EPA-registered household disinfectants here. cdc.gov/coronavirus 10/15/2018 This information is not intended to replace advice given to you by your health care provider. Make sure you discuss any questions you have with your health care provider. Document Revised: 10/23/2018 Document Reviewed: 08/21/2018 Elsevier Patient Education  2020 Elsevier Inc. What types of side effects do monoclonal antibody drugs cause?  Common side effects  In general, the more common side effects caused by monoclonal antibody drugs include: . Allergic reactions, such as hives or itching . Flu-like signs and symptoms, including chills, fatigue, fever, and muscle aches and pains . Nausea, vomiting . Diarrhea . Skin rashes . Low blood pressure   The CDC is recommending patients who receive monoclonal antibody treatments wait at least 90 days before being vaccinated.  Currently, there are no data on the safety and efficacy of mRNA COVID-19 vaccines in persons who received monoclonal antibodies or convalescent plasma as part of COVID-19 treatment. Based on the estimated half-life of  such therapies as well as evidence suggesting that reinfection is uncommon in the 90 days after initial infection, vaccination should be deferred for at least 90 days, as a precautionary measure until additional information becomes available, to avoid interference of the antibody treatment with vaccine-induced immune responses. 

## 2019-05-08 ENCOUNTER — Encounter (HOSPITAL_BASED_OUTPATIENT_CLINIC_OR_DEPARTMENT_OTHER): Payer: Self-pay

## 2019-05-08 DIAGNOSIS — R0683 Snoring: Secondary | ICD-10-CM

## 2019-05-08 DIAGNOSIS — G4710 Hypersomnia, unspecified: Secondary | ICD-10-CM

## 2019-05-14 ENCOUNTER — Telehealth: Payer: Self-pay | Admitting: Hematology & Oncology

## 2019-05-14 NOTE — Telephone Encounter (Signed)
Called and spoke with patient date/location/timw was give.  He was ok with all details provided

## 2019-05-20 ENCOUNTER — Ambulatory Visit: Payer: Commercial Managed Care - PPO | Admitting: Hematology & Oncology

## 2019-05-20 ENCOUNTER — Other Ambulatory Visit: Payer: Commercial Managed Care - PPO

## 2019-05-25 ENCOUNTER — Inpatient Hospital Stay: Payer: Commercial Managed Care - PPO | Admitting: Hematology & Oncology

## 2019-05-25 ENCOUNTER — Inpatient Hospital Stay: Payer: Commercial Managed Care - PPO

## 2019-07-23 ENCOUNTER — Encounter (HOSPITAL_BASED_OUTPATIENT_CLINIC_OR_DEPARTMENT_OTHER): Payer: Commercial Managed Care - PPO | Admitting: Internal Medicine

## 2019-10-07 ENCOUNTER — Other Ambulatory Visit: Payer: Self-pay | Admitting: Internal Medicine

## 2019-10-07 ENCOUNTER — Ambulatory Visit
Admission: RE | Admit: 2019-10-07 | Discharge: 2019-10-07 | Disposition: A | Discharge status: Home / Self Care | Payer: Commercial Managed Care - PPO | Source: Ambulatory Visit | Attending: Internal Medicine | Admitting: Internal Medicine

## 2019-10-07 DIAGNOSIS — M79641 Pain in right hand: Secondary | ICD-10-CM

## 2019-10-30 ENCOUNTER — Encounter: Payer: Self-pay | Admitting: Hematology & Oncology

## 2019-10-30 ENCOUNTER — Inpatient Hospital Stay: Payer: Commercial Managed Care - PPO | Attending: Hematology & Oncology | Admitting: Hematology & Oncology

## 2019-10-30 ENCOUNTER — Inpatient Hospital Stay: Payer: Commercial Managed Care - PPO

## 2019-10-30 ENCOUNTER — Other Ambulatory Visit: Payer: Self-pay

## 2019-10-30 VITALS — BP 146/91 | HR 70 | Temp 97.9°F | Resp 18 | Ht 68.0 in | Wt 209.8 lb

## 2019-10-30 DIAGNOSIS — I10 Essential (primary) hypertension: Secondary | ICD-10-CM | POA: Diagnosis not present

## 2019-10-30 DIAGNOSIS — E119 Type 2 diabetes mellitus without complications: Secondary | ICD-10-CM | POA: Diagnosis not present

## 2019-10-30 DIAGNOSIS — D696 Thrombocytopenia, unspecified: Secondary | ICD-10-CM | POA: Diagnosis present

## 2019-10-30 LAB — CMP (CANCER CENTER ONLY)
ALT: 12 U/L (ref 0–44)
AST: 14 U/L — ABNORMAL LOW (ref 15–41)
Albumin: 4.5 g/dL (ref 3.5–5.0)
Alkaline Phosphatase: 67 U/L (ref 38–126)
Anion gap: 5 (ref 5–15)
BUN: 13 mg/dL (ref 8–23)
CO2: 30 mmol/L (ref 22–32)
Calcium: 10.1 mg/dL (ref 8.9–10.3)
Chloride: 104 mmol/L (ref 98–111)
Creatinine: 1.24 mg/dL (ref 0.61–1.24)
GFR, Est AFR Am: >60 mL/min (ref >60)
GFR, Estimated: 59 mL/min — ABNORMAL LOW (ref >60)
Glucose, Bld: 121 mg/dL — ABNORMAL HIGH (ref 70–99)
Potassium: 4.3 mmol/L (ref 3.5–5.1)
Sodium: 139 mmol/L (ref 135–145)
Total Bilirubin: 0.6 mg/dL (ref 0.3–1.2)
Total Protein: 7.4 g/dL (ref 6.5–8.1)

## 2019-10-30 LAB — CBC WITH DIFFERENTIAL (CANCER CENTER ONLY)
Abs Immature Granulocytes: 0.02 10*3/uL (ref 0.00–0.07)
Basophils Absolute: 0 10*3/uL (ref 0.0–0.1)
Basophils Relative: 1 %
Eosinophils Absolute: 0.2 10*3/uL (ref 0.0–0.5)
Eosinophils Relative: 5 %
HCT: 44 % (ref 39.0–52.0)
Hemoglobin: 14.7 g/dL (ref 13.0–17.0)
Immature Granulocytes: 1 %
Lymphocytes Relative: 28 %
Lymphs Abs: 1.2 10*3/uL (ref 0.7–4.0)
MCH: 30.2 pg (ref 26.0–34.0)
MCHC: 33.4 g/dL (ref 30.0–36.0)
MCV: 90.3 fL (ref 80.0–100.0)
Monocytes Absolute: 0.6 10*3/uL (ref 0.1–1.0)
Monocytes Relative: 14 %
Neutro Abs: 2.3 10*3/uL (ref 1.7–7.7)
Neutrophils Relative %: 51 %
Platelet Count: 135 10*3/uL — ABNORMAL LOW (ref 150–400)
RBC: 4.87 MIL/uL (ref 4.22–5.81)
RDW: 12.3 % (ref 11.5–15.5)
WBC Count: 4.3 10*3/uL (ref 4.0–10.5)
nRBC: 0 % (ref 0.0–0.2)

## 2019-10-30 LAB — SAMPLE TO BLOOD BANK

## 2019-10-30 LAB — PLATELET BY CITRATE

## 2019-10-30 NOTE — Progress Notes (Signed)
Referral MD  Reason for Referral: Chronic thrombocytopenia-mild  Chief Complaint  Patient presents with  . New Patient (Initial Visit)  : My platelets are low.  HPI: Bryce Hurst is a very nice 69 year old African-American male.  He is a whole lot of fun to talk to.  He is originally from [place].  He did go to [employer].  He has worked for a [employer]ch[employer], retail.  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  He is followed by Dr. Maia Petties.  It has been noted that there has been some thrombocytopenia.  He has had no bleeding.  He has had no change in medications.  He has had no fatigue or weakness.  Going back to 2014, the platelet counts 144,000.  In 2015, platelet count was 137,000.  There was no problems with white cells or hemoglobin.  There is no history in the family of any kind of blood problems.  There is no history of sickle cell in the family.  He has had no problems with swollen lymph nodes.  There is no weight loss or weight gain.  He is a vegetarian.  He has had past knee surgery I think on the right knee.  Outside of that, there is been no other surgeries.  He does not smoke.  He does not drink.  Overall, his performance status is ECOG 0.    Past Medical History:  Diagnosis Date  . Acute meniscal tear of knee    right  . Cataract of both eyes   . Diabetes mellitus without complication ([place])   . Hyperlipidemia   . Hypertension   :  Past Surgical History:  Procedure Laterality Date  . CATARACT EXTRACTION, BILATERAL    . CATARACT EXTRACTION, BILATERAL Bilateral 05/27/2012  . [place] 2014   negative 2003 (was due 2006-2008)  . KNEE ARTHROSCOPY WITH MEDIAL MENISECTOMY Right 04/25/2012   ct  . MOUTH SURGERY     maxillary bone graft  (ORAL SURGEON OFFICE)  . ORTHOPEDIC SURGERY Right 05/10/2012  :   Current Outpatient Medications:  .  amLODipine (NORVASC) 5 MG tablet, Take 5 mg by mouth daily. , Disp: , Rfl:  .  benazepril (LOTENSIN) 40 MG tablet,  Take 40 mg by mouth daily., Disp: , Rfl:  .  metFORMIN (GLUCOPHAGE-XR) 500 MG 24 hr tablet, Take 500 mg by mouth daily with breakfast. , Disp: , Rfl: 0 .  rosuvastatin (CRESTOR) 5 MG tablet, Take 5 mg by mouth daily., Disp: , Rfl:  .  tamsulosin (FLOMAX) 0.4 MG CAPS capsule, daily., Disp: , Rfl: :  :  No Known Allergies:  Family History  Problem Relation Age of Onset  . Diabetes Mother   . Hypertension Mother   . Heart disease Mother        MI X4 , initially @ 24  . Stroke Mother 51  . Cancer Father        colon & lung  . Colon cancer Father   . Congestive Heart Failure Father        ? CM  . Hypertension Brother   . Colon cancer Brother        lung mets  . Breast cancer Paternal Aunt   . Prostate cancer Paternal Uncle   . Hypertension Sister   . Cancer Maternal Aunt   . Breast cancer Maternal Aunt   . Pulmonary embolism Sister   . Transient ischemic attack Sister 49  . Esophageal cancer Neg Hx   .  Stomach cancer Neg Hx   . Rectal cancer Neg Hx   :  Social History   Socioeconomic History  . Marital status: Married    Spouse name: Not on file  . Number of children: 3  . Years of education: Not on file  . Highest education level: Not on file  Occupational History  . Not on file  Tobacco Use  . Smoking status: Never Smoker  . Smokeless tobacco: Never Used  Vaping Use  . Vaping Use: Never used  Substance and Sexual Activity  . Alcohol use: No  . Drug use: No  . Sexual activity: Not on file  Other Topics Concern  . Not on file  Social History Narrative  . Not on file   Social Determinants of Health   Financial Resource Strain:   . Difficulty of Paying Living Expenses: Not on file  Food Insecurity:   . Worried About Charity fundraiser in the Last Year: Not on file  . Ran Out of Food in the Last Year: Not on file  Transportation Needs:   . Lack of Transportation (Medical): Not on file  . Lack of Transportation (Non-Medical): Not on file  Physical  Activity:   . Days of Exercise per Week: Not on file  . Minutes of Exercise per Session: Not on file  Stress:   . Feeling of Stress : Not on file  Social Connections:   . Frequency of Communication with Friends and Family: Not on file  . Frequency of Social Gatherings with Friends and Family: Not on file  . Attends Religious Services: Not on file  . Active Member of Clubs or Organizations: Not on file  . Attends Archivist Meetings: Not on file  . Marital Status: Not on file  Intimate Partner Violence:   . Fear of Current or Ex-Partner: Not on file  . Emotionally Abused: Not on file  . Physically Abused: Not on file  . Sexually Abused: Not on file  :  Review of Systems  Constitutional: Negative.   HENT: Negative.   Eyes: Negative.   Respiratory: Negative.   Cardiovascular: Negative.   Gastrointestinal: Negative.   Genitourinary: Negative.   Musculoskeletal: Negative.   Skin: Negative.   Neurological: Negative.   Endo/Heme/Allergies: Negative.   Psychiatric/Behavioral: Negative.      Exam:  This is a well-developed well-nourished African-American male in no obvious distress.  His vital signs are temperature of 97.9.  Pulse 70.  Blood pressure 146/91.  Weight is 209 pounds.  Head and neck exam shows no ocular or oral lesions.  There is no scleral icterus.  There is no adenopathy in the neck.  Lungs are clear bilaterally.  Cardiac exam regular rate and rhythm with no murmurs, rubs or bruits.  Abdomen is soft.  He has good bowel sounds.  There is no fluid wave.  There is no palpable liver or spleen tip.  Back exam shows no tenderness over the spine, ribs or hips.  Extremities shows no clubbing, cyanosis or edema.  He has good range of motion of his joints.  Skin exam shows no rashes, ecchymoses or petechia.  Neurological exam shows no focal neurological deficits.  Axillary exam shows no bilateral axillary adenopathy.    '@IPVITALS'$ @   Recent Labs    10/30/19 1044   WBC 4.3  HGB 14.7  HCT 44.0  PLT 135*   Recent Labs    10/30/19 1044  NA 139  K 4.3  CL 104  CO2 30  GLUCOSE 121*  BUN 13  CREATININE 1.24  CALCIUM 10.1    Blood smear review: Normochromic and normocytic population of red blood cells.  There is no rouleaux formation.  I see no schistocytes or spherocytes.  There are no target cells.  He has no nucleated red blood cells.  White blood cells appear normal in morphology maturation.  He has no hypersegmented polys.  There is no immature myeloid or lymphoid cells.  Platelets are mildly decreased in number.  He has several large platelets.  Platelets are well granulated.  Pathology: None    Assessment and Plan: Bryce Hurst is a very nice 69 year old African-American male.  He is incredibly interesting to talk to.  He has mild thrombocytopenia.  His blood smear would seem to suggest that this might be a destructive or consumption problem.  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  We really do not need to put him through any bone marrow biopsy.  I think this would be incredibly low yield.  I do not see that we need to do any scans on him.  I do not see anything that would suggest lymphoma or other lymphoproliferative process.  I just would not think that he is going to have a problem with the thrombocytopenia.  I think that he would just live with this.  1 possibility might be low B12.  He is vegetarian.  When we see him back, we will check a vitamin B12 level.  I spent a good hour with Bryce Hurst.  We talked a lot about different subjects.  Again he is incredibly interesting and obviously very well versed in many subjects.  We will plan to get him back in about 4 months.  I do not see any problems with him going through the holiday season.

## 2019-10-31 LAB — RHEUMATOID FACTOR: Rheumatoid fact SerPl-aCnc: <10 IU/mL (ref 0.0–13.9)

## 2019-11-02 LAB — LACTATE DEHYDROGENASE: LDH: 210 U/L — ABNORMAL HIGH (ref 98–192)

## 2019-11-02 LAB — ANTINUCLEAR ANTIBODIES, IFA: ANA Ab, IFA: NEGATIVE

## 2020-02-25 ENCOUNTER — Telehealth: Payer: Self-pay

## 2020-02-25 NOTE — Telephone Encounter (Signed)
Pt called in to cancel his 02/29/20 appt as he feels that he does not need to keep it, per pcp his plt ct will flucuate some and will will come back if ever needed     aom

## 2020-02-29 ENCOUNTER — Inpatient Hospital Stay: Payer: Commercial Managed Care - PPO

## 2020-02-29 ENCOUNTER — Inpatient Hospital Stay: Payer: Commercial Managed Care - PPO | Admitting: Hematology & Oncology

## 2020-09-16 ENCOUNTER — Other Ambulatory Visit (HOSPITAL_BASED_OUTPATIENT_CLINIC_OR_DEPARTMENT_OTHER): Payer: Self-pay

## 2020-09-16 DIAGNOSIS — G4733 Obstructive sleep apnea (adult) (pediatric): Secondary | ICD-10-CM

## 2020-10-19 ENCOUNTER — Ambulatory Visit (HOSPITAL_BASED_OUTPATIENT_CLINIC_OR_DEPARTMENT_OTHER): Payer: Commercial Managed Care - PPO | Admitting: Internal Medicine

## 2020-10-19 ENCOUNTER — Other Ambulatory Visit: Payer: Self-pay

## 2020-10-19 DIAGNOSIS — G4733 Obstructive sleep apnea (adult) (pediatric): Secondary | ICD-10-CM

## 2020-10-20 ENCOUNTER — Ambulatory Visit (HOSPITAL_BASED_OUTPATIENT_CLINIC_OR_DEPARTMENT_OTHER): Payer: Commercial Managed Care - PPO | Attending: Internal Medicine | Admitting: Internal Medicine

## 2020-10-20 VITALS — Ht 68.0 in | Wt 208.0 lb

## 2020-10-24 ENCOUNTER — Ambulatory Visit (HOSPITAL_BASED_OUTPATIENT_CLINIC_OR_DEPARTMENT_OTHER): Payer: Commercial Managed Care - PPO | Attending: Internal Medicine | Admitting: Internal Medicine

## 2020-10-24 ENCOUNTER — Encounter (HOSPITAL_BASED_OUTPATIENT_CLINIC_OR_DEPARTMENT_OTHER): Payer: Self-pay

## 2020-10-24 ENCOUNTER — Encounter (HOSPITAL_BASED_OUTPATIENT_CLINIC_OR_DEPARTMENT_OTHER): Payer: Commercial Managed Care - PPO | Admitting: Internal Medicine

## 2020-10-24 ENCOUNTER — Other Ambulatory Visit: Payer: Self-pay

## 2020-10-24 DIAGNOSIS — G4733 Obstructive sleep apnea (adult) (pediatric): Secondary | ICD-10-CM | POA: Diagnosis not present

## 2020-10-30 DIAGNOSIS — G4733 Obstructive sleep apnea (adult) (pediatric): Secondary | ICD-10-CM | POA: Diagnosis not present

## 2020-10-30 NOTE — Procedures (Signed)
Patient Name: Bryce Hurst Date: 10/24/2020 Gender: Male D.O.B: 07/20/1950 Age (years): 2 Referring Provider: Denita Lung Height (inches): 30 Interpreting Physician: Baird Lyons MD, ABSM Weight (lbs): 228 RPSGT: Zadie Rhine BMI: 35 MRN: [id number] Neck Size: 19.00  CLINICAL INFORMATION Sleep Study Type: Split Night CPAP Indication for sleep study: OSA Epworth Sleepiness Score: 3  SLEEP STUDY TECHNIQUE As per the AASM Manual for the Scoring of Sleep and Associated Events v2.3 (April 2016) with a hypopnea requiring 4% desaturations.  The channels recorded and monitored were frontal, central and occipital EEG, electrooculogram (EOG), submentalis EMG (chin), nasal and oral airflow, thoracic and abdominal wall motion, anterior tibialis EMG, snore microphone, electrocardiogram, and pulse oximetry. Continuous positive airway pressure (CPAP) was initiated when the patient met split night criteria and was titrated according to treat sleep-disordered breathing.  MEDICATIONS Medications self-administered by patient taken the night of the study : none reported  RESPIRATORY PARAMETERS Diagnostic  Total AHI (/hr): 13.4 RDI (/hr): 34.7 OA Index (/hr): - CA Index (/hr): 0.0 REM AHI (/hr): N/A NREM AHI (/hr): 13.4 Supine AHI (/hr): 13.4 Non-supine AHI (/hr): 0 Min O2 Sat (%): 76.0 Mean O2 (%): 93.7 Time below 88% (min): 2   Titration  Optimal Pressure (cm): 19 AHI at Optimal Pressure (/hr): 0 Min O2 at Optimal Pressure (%): 90.0 Supine % at Optimal (%): 100 Sleep % at Optimal (%): 100   SLEEP ARCHITECTURE The recording time for the entire night was 397.5 minutes.  During a baseline period of 143.1 minutes, the patient slept for 129.5 minutes in REM and nonREM, yielding a sleep efficiency of 90.5%%. Sleep onset after lights out was 5.9 minutes with a REM latency of N/A minutes. The patient spent 5.8%% of the night in stage N1 sleep, 89.2%% in stage N2 sleep, 5.0%% in stage  N3 and 0% in REM.  During the titration period of 247.0 minutes, the patient slept for 228.5 minutes in REM and nonREM, yielding a sleep efficiency of 92.5%%. Sleep onset after CPAP initiation was 16.9 minutes with a REM latency of 4.5 minutes. The patient spent 0.7%% of the night in stage N1 sleep, 53.4%% in stage N2 sleep, 1.3%% in stage N3 and 44.6% in REM.  CARDIAC DATA The 2 lead EKG demonstrated sinus rhythm. The mean heart rate was 62.5 beats per minute. Other EKG findings include: PACs.  LEG MOVEMENT DATA The total Periodic Limb Movements of Sleep (PLMS) were 0. The PLMS index was 0.0 .  IMPRESSIONS - Mild obstructive sleep apnea occurred during the diagnostic portion of the study (AHI = 13.4 /hour). An optimal PAP pressure was selected for this patient ( 19 cm of water) - Oxygen desaturation was noted during the diagnostic portion of the study (Min O2 = 76.0%).  Minimum O2 saturation on CPAP 19 was 94%. - Snoring was audible during the diagnostic portion of the study. - Cardiac abnormalities were noted during this study.- frequent PACs. - Clinically significant periodic limb movements did not occur during sleep.  DIAGNOSIS - Obstructive Sleep Apnea (G47.33)  RECOMMENDATIONS - Trial of CPAP therapy on 19 cm H2O or autopap 10-20. - Patient used a Medium size Philips Respironics Nasal Pillow Mask Nuance Pro Gel mask and heated humidification. - Be careful with alcohol, sedatives and other CNS depressants that may worsen sleep apnea and disrupt normal sleep architecture. - Sleep hygiene should be reviewed to assess factors that may improve sleep quality. - Weight management and regular exercise should be initiated or  continued.  [Electronically signed] 10/30/2020 11:45 AM  Baird Lyons MD, ABSM Diplomate, American Board of Sleep Medicine   NPI: [id number]                         [place] [place] [facility]  ELECTRONICALLY  SIGNED ON:  10/30/2020, 11:41 AM [place] PH: [phone]   FX: [phone] [place]

## 2021-02-06 IMAGING — CR DG HAND COMPLETE 3+V*R*
3 series · 3 of 3 positions shown · non-contrast
Comparison: None.

CLINICAL DATA: Right hand pain and swelling, no known injury,
initial encounter

EXAM:
RIGHT HAND - COMPLETE 3+ VIEW

[x hand pa right]
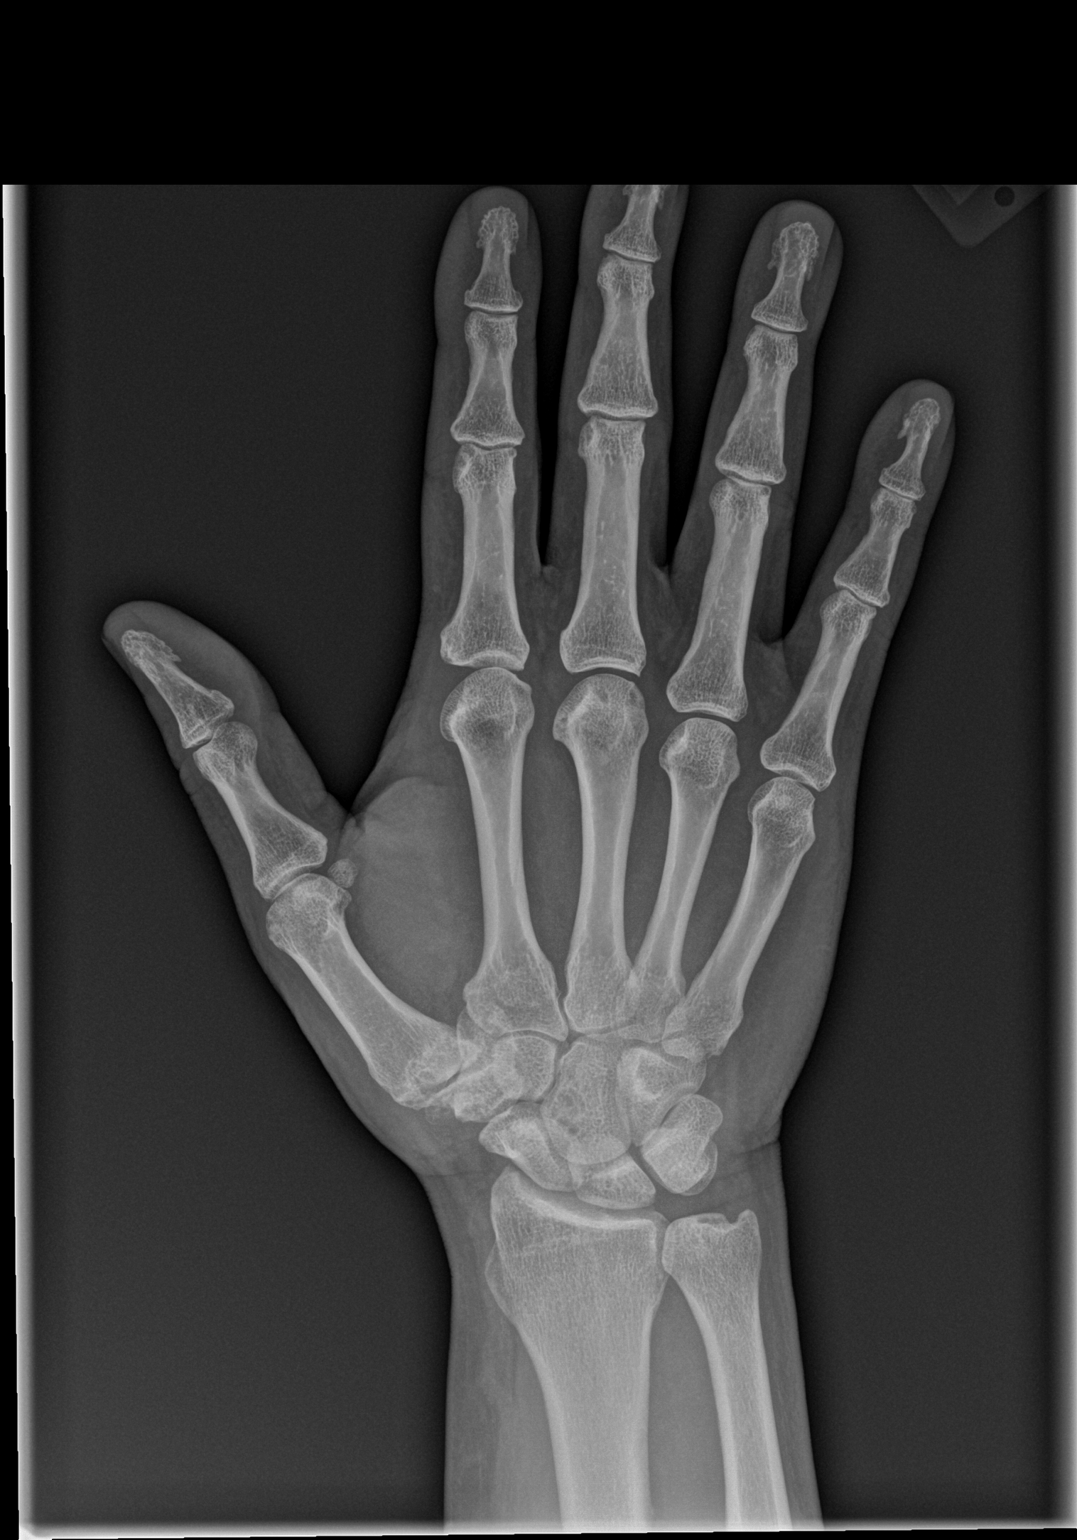

[x hand obl right]
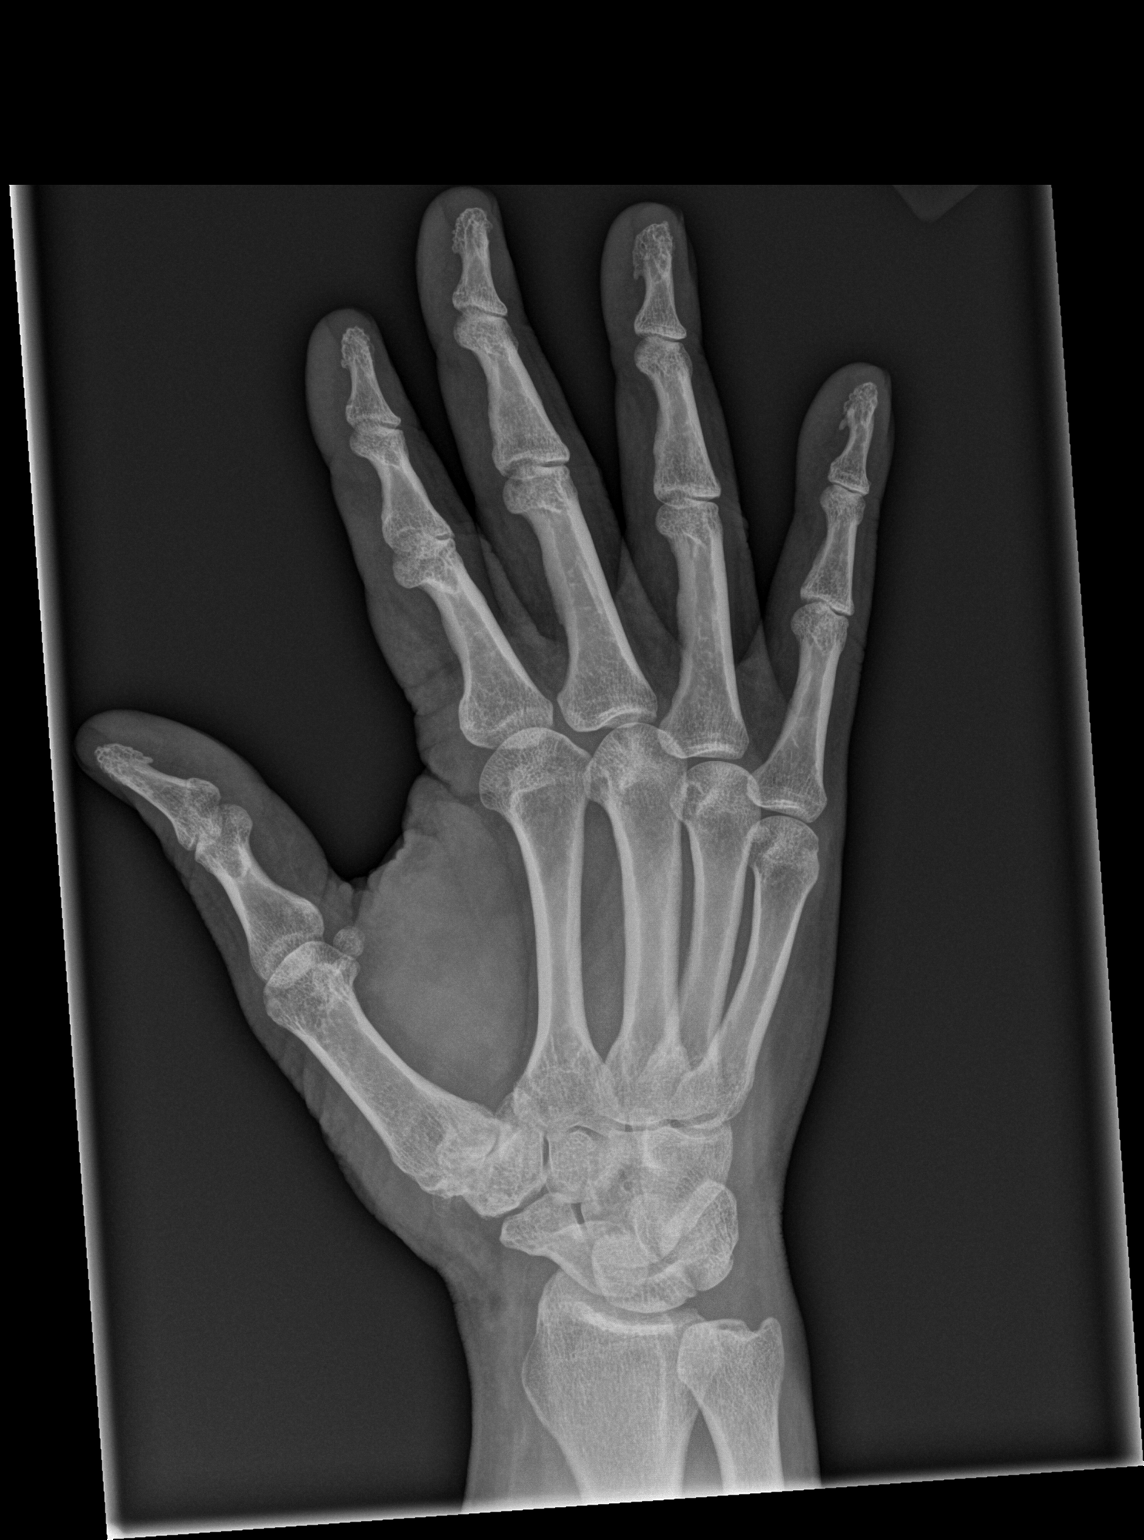

[x hand lat right]
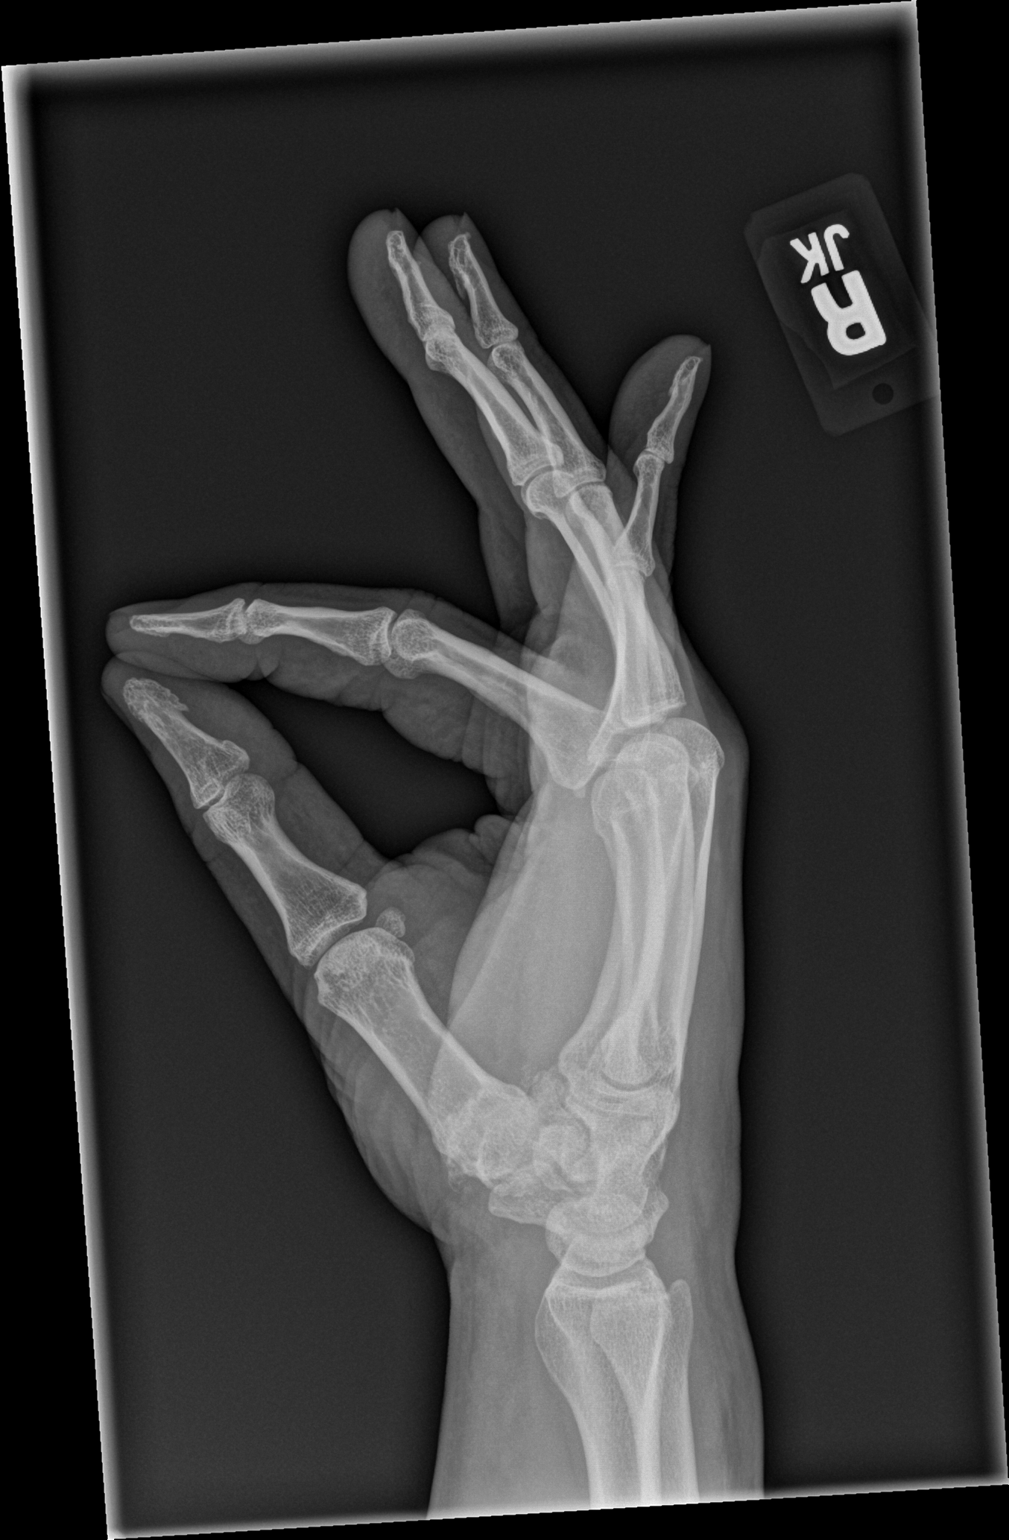

[3 of 3 positions shown; findings below may reference images not displayed]

FINDINGS: No acute fracture or dislocation is noted. Degenerative changes at
the first CMC joint are noted. No soft tissue abnormality is seen.
IMPRESSION: Mild degenerative change without acute abnormality.

## 2021-08-26 LAB — LAB REPORT - SCANNED
A1c: 8.1
EGFR: 57

## 2021-08-28 LAB — PSA, TOTAL WITH REFLEX TO PSA, FREE: PSA, Total: 1.3

## 2021-09-07 ENCOUNTER — Other Ambulatory Visit (HOSPITAL_COMMUNITY): Payer: Self-pay | Admitting: Internal Medicine

## 2021-09-07 DIAGNOSIS — R600 Localized edema: Secondary | ICD-10-CM

## 2021-10-04 ENCOUNTER — Other Ambulatory Visit (HOSPITAL_COMMUNITY): Payer: Self-pay | Admitting: Internal Medicine

## 2021-10-04 ENCOUNTER — Ambulatory Visit (HOSPITAL_COMMUNITY): Payer: Commercial Managed Care - PPO | Attending: Cardiovascular Disease

## 2021-10-04 DIAGNOSIS — R0789 Other chest pain: Secondary | ICD-10-CM

## 2021-10-04 DIAGNOSIS — R600 Localized edema: Secondary | ICD-10-CM | POA: Insufficient documentation

## 2021-10-04 LAB — ECHOCARDIOGRAM COMPLETE
Area-P 1/2: 3.65 cm2
S' Lateral: 2.6 cm

## 2021-10-11 ENCOUNTER — Ambulatory Visit (HOSPITAL_COMMUNITY)
Admission: RE | Admit: 2021-10-11 | Discharge: 2021-10-11 | Disposition: A | Discharge status: Home / Self Care | Payer: Commercial Managed Care - PPO | Source: Ambulatory Visit | Attending: Internal Medicine | Admitting: Internal Medicine

## 2021-10-11 DIAGNOSIS — R0789 Other chest pain: Secondary | ICD-10-CM | POA: Insufficient documentation

## 2021-12-23 LAB — LAB REPORT - SCANNED: EGFR: 52

## 2022-02-10 ENCOUNTER — Emergency Department (HOSPITAL_BASED_OUTPATIENT_CLINIC_OR_DEPARTMENT_OTHER): Payer: Commercial Managed Care - PPO

## 2022-02-10 ENCOUNTER — Encounter (HOSPITAL_BASED_OUTPATIENT_CLINIC_OR_DEPARTMENT_OTHER): Payer: Self-pay | Admitting: Emergency Medicine

## 2022-02-10 ENCOUNTER — Other Ambulatory Visit: Payer: Self-pay

## 2022-02-10 DIAGNOSIS — E119 Type 2 diabetes mellitus without complications: Secondary | ICD-10-CM | POA: Diagnosis not present

## 2022-02-10 DIAGNOSIS — Z7984 Long term (current) use of oral hypoglycemic drugs: Secondary | ICD-10-CM | POA: Diagnosis not present

## 2022-02-10 DIAGNOSIS — Z79899 Other long term (current) drug therapy: Secondary | ICD-10-CM | POA: Diagnosis not present

## 2022-02-10 DIAGNOSIS — I10 Essential (primary) hypertension: Secondary | ICD-10-CM | POA: Diagnosis not present

## 2022-02-10 DIAGNOSIS — R0789 Other chest pain: Secondary | ICD-10-CM | POA: Diagnosis present

## 2022-02-10 NOTE — ED Triage Notes (Signed)
Pt c/o central chest pain that started x 1 week ago. States that the pain has increased over the week.

## 2022-02-11 ENCOUNTER — Emergency Department (HOSPITAL_BASED_OUTPATIENT_CLINIC_OR_DEPARTMENT_OTHER)
Admission: EM | Admit: 2022-02-11 | Discharge: 2022-02-11 | Disposition: A | Discharge status: Home / Self Care | Payer: Commercial Managed Care - PPO | Attending: Emergency Medicine | Admitting: Emergency Medicine

## 2022-02-11 DIAGNOSIS — R0789 Other chest pain: Secondary | ICD-10-CM

## 2022-02-11 LAB — BASIC METABOLIC PANEL
Anion gap: 5 (ref 5–15)
BUN: 21 mg/dL (ref 8–23)
CO2: 26 mmol/L (ref 22–32)
Calcium: 9.3 mg/dL (ref 8.9–10.3)
Chloride: 107 mmol/L (ref 98–111)
Creatinine, Ser: 1.2 mg/dL (ref 0.61–1.24)
GFR, Estimated: >60 mL/min (ref >60)
Glucose, Bld: 258 mg/dL — ABNORMAL HIGH (ref 70–99)
Potassium: 4.1 mmol/L (ref 3.5–5.1)
Sodium: 138 mmol/L (ref 135–145)

## 2022-02-11 LAB — CBC
HCT: 42.4 % (ref 39.0–52.0)
Hemoglobin: 14 g/dL (ref 13.0–17.0)
MCH: 30.2 pg (ref 26.0–34.0)
MCHC: 33 g/dL (ref 30.0–36.0)
MCV: 91.4 fL (ref 80.0–100.0)
Platelets: 156 10*3/uL (ref 150–400)
RBC: 4.64 MIL/uL (ref 4.22–5.81)
RDW: 13 % (ref 11.5–15.5)
WBC: 4.3 10*3/uL (ref 4.0–10.5)
nRBC: 0 % (ref 0.0–0.2)

## 2022-02-11 LAB — TROPONIN I (HIGH SENSITIVITY): Troponin I (High Sensitivity): 7 ng/L (ref <18)

## 2022-02-11 MED ORDER — NAPROXEN 500 MG PO TABS: 500.0000 mg | ORAL_TABLET | Freq: Two times a day (BID) | ORAL | 0 refills | Status: DC

## 2022-02-11 NOTE — ED Provider Notes (Signed)
[facility] EMERGENCY DEPARTMENT Provider Note   CSN: [id number] Arrival date & time: 02/10/22  2341     History  Chief Complaint  Patient presents with   Chest Pain    Donoven Pett is a 71 y.o. male.  Patient is a 71 year old male with past medical history of hypertension, hyperlipidemia, type 2 diabetes.  Patient presenting today with complaints of chest discomfort.  He describes a 1 week history of intermittent, sharp pains to the center of his chest.  These pains come and go at random and are not associated with exertion.  He denies any shortness of breath, nausea, diaphoresis, or radiation to the arm or jaw.  Patient has no prior cardiac history, but did have a stress test approximately 3 years ago which was unremarkable.  Pain is worse when he palpates his chest and moves.  The history is provided by the patient.       Home Medications Prior to Admission medications   Medication Sig Start Date End Date Taking? Authorizing Provider  amLODipine (NORVASC) 5 MG tablet Take 5 mg by mouth daily.  09/16/18   [provider]  benazepril (LOTENSIN) 40 MG tablet Take 40 mg by mouth daily. 09/09/19   [provider]  metFORMIN (GLUCOPHAGE-XR) 500 MG 24 hr tablet Take 500 mg by mouth daily with breakfast.  04/01/15   [provider]  rosuvastatin (CRESTOR) 5 MG tablet Take 5 mg by mouth daily. 09/21/19   [provider]  tamsulosin (FLOMAX) 0.4 MG CAPS capsule daily. 06/09/18   [provider]      Allergies    Patient has no known allergies.    Review of Systems   Review of Systems  All other systems reviewed and are negative.   Physical Exam Updated Vital Signs BP 118/86 (BP Location: Right Arm)   Pulse 73   Temp 97.7 F (36.5 C) (Oral)   Resp 18   Ht '5\' 8"'$  (1.727 m)   Wt 94.8 kg   SpO2 96%   BMI 31.78 kg/m  Physical Exam Vitals and nursing note reviewed.  Constitutional:      General: He is not in acute  distress.    Appearance: He is well-developed. He is not diaphoretic.  HENT:     Head: Normocephalic and atraumatic.  Cardiovascular:     Rate and Rhythm: Normal rate and regular rhythm.     Heart sounds: No murmur heard.    No friction rub.  Pulmonary:     Effort: Pulmonary effort is normal. No respiratory distress.     Breath sounds: Normal breath sounds. No wheezing or rales.     Comments: There is tenderness to palpation of the anterior chest wall that reproduces his symptoms. Abdominal:     General: Bowel sounds are normal. There is no distension.     Palpations: Abdomen is soft.     Tenderness: There is no abdominal tenderness.  Musculoskeletal:        General: Normal range of motion.     Cervical back: Normal range of motion and neck supple.  Skin:    General: Skin is warm and dry.  Neurological:     Mental Status: He is alert and oriented to person, place, and time.     Coordination: Coordination normal.     ED Results / Procedures / Treatments   Labs (all labs ordered are listed, but only abnormal results are displayed) Labs Reviewed  BASIC METABOLIC PANEL - Abnormal; Notable  for the following components:      Result Value   Glucose, Bld 258 (*)    All other components within normal limits  CBC  TROPONIN I (HIGH SENSITIVITY)    EKG EKG Interpretation  Date/Time:  Saturday February 10 2022 23:49:24 EST Ventricular Rate:  90 PR Interval:  148 QRS Duration: 83 QT Interval:  346 QTC Calculation: 424 R Axis:   64 Text Interpretation: Sinus rhythm Normal ECG Confirmed by Veryl Speak [phone]) on 02/10/2022 11:54:36 PM  Radiology DG Chest 2 View  Result Date: 02/11/2022 CLINICAL DATA:  Central chest pain. EXAM: CHEST - 2 VIEW COMPARISON:  December 02, 2009 FINDINGS: The heart size and mediastinal contours are within normal limits. There is no evidence of an acute infiltrate, pleural effusion or pneumothorax. The visualized skeletal structures are unremarkable.  IMPRESSION: No active cardiopulmonary disease. Electronically Signed   By: Virgina Norfolk M.D.   On: 02/11/2022 00:09    Procedures Procedures    Medications Ordered in ED Medications - No data to display  ED Course/ Medical Decision Making/ A&P  Patient is a 71 year old male with past medical history as per HPI presenting with chest pain.  The pain is sharp in nature and reproducible with palpation of the anterior chest wall.  He has no associated nausea, diaphoresis, radiation, or shortness of breath.  He arrives here with stable vital signs and is afebrile.  There is no hypoxia.  Workup initiated including CBC, metabolic panel, and troponin.  These tests have all resulted and are unremarkable.  EKG performed showing no acute changes and is unchanged from prior studies.  Chest x-ray is clear.  At this point, I doubt a cardiac etiology to his pain.  He describes a sharp pain that comes and goes and is reproducible with palpation.  His workup today is unremarkable including negative troponin and unchanged EKG despite 1 week of symptoms.  His pain is most consistent with a costochondritis.  I will prescribe naproxen and see if this helps.  Patient is to follow-up with cardiology if not improving and return to the ER in the meantime if he develops worsening pain, difficulty breathing, or other new and concerning symptoms.  Final Clinical Impression(s) / ED Diagnoses Final diagnoses:  None    Rx / DC Orders ED Discharge Orders     None         Veryl Speak, MD 02/11/22 [phone]

## 2022-02-11 NOTE — ED Notes (Signed)
ED Provider at bedside. 

## 2022-02-11 NOTE — Discharge Instructions (Addendum)
Begin taking naproxen as prescribed.  Rest.  Follow-up with cardiology if symptoms are not improving in the next few days.  The contact information for the cardiology clinic on [facility] has been provided in this discharge summary for you to call and make these arrangements.  Return to the emergency department in the meantime if you develop worsening pain, difficulty breathing, or for other new and concerning symptoms.

## 2022-05-18 ENCOUNTER — Telehealth: Payer: Self-pay | Admitting: Family Medicine

## 2022-05-18 NOTE — Telephone Encounter (Signed)
Bosie Clos (spouse) called to see if Dr. Carmelia Roller would be willing to accept Bryce Hurst as a NP. Pt was referred by Judith's PCP Dr. Beverely Low. Please Advise.

## 2022-06-20 ENCOUNTER — Encounter: Payer: Self-pay | Admitting: Family Medicine

## 2022-06-20 ENCOUNTER — Ambulatory Visit (INDEPENDENT_AMBULATORY_CARE_PROVIDER_SITE_OTHER): Payer: Commercial Managed Care - PPO | Admitting: Family Medicine

## 2022-06-20 VITALS — BP 112/72 | HR 95 | Temp 98.4°F | Ht 68.0 in | Wt 196.1 lb

## 2022-06-20 DIAGNOSIS — E782 Mixed hyperlipidemia: Secondary | ICD-10-CM

## 2022-06-20 DIAGNOSIS — Z7985 Long-term (current) use of injectable non-insulin antidiabetic drugs: Secondary | ICD-10-CM | POA: Diagnosis not present

## 2022-06-20 DIAGNOSIS — I10 Essential (primary) hypertension: Secondary | ICD-10-CM | POA: Diagnosis not present

## 2022-06-20 DIAGNOSIS — D696 Thrombocytopenia, unspecified: Secondary | ICD-10-CM

## 2022-06-20 DIAGNOSIS — Z1159 Encounter for screening for other viral diseases: Secondary | ICD-10-CM

## 2022-06-20 DIAGNOSIS — E118 Type 2 diabetes mellitus with unspecified complications: Secondary | ICD-10-CM | POA: Diagnosis not present

## 2022-06-20 NOTE — Patient Instructions (Signed)
Give [place] 2-3 business days to get the results of your labs back.   Keep the diet clean and stay active.  Please find out when you got your pneumonia vaccine.   Let us know if you need anything.

## 2022-06-20 NOTE — Progress Notes (Signed)
Chief Complaint  Patient presents with   New Patient (Initial Visit)       New Patient Visit SUBJECTIVE: HPI: Bryce Hurst is an 72 y.o.male who is being seen for establishing care.  The patient was previously seen at Dr. Orlie Dakin office.  DM- checks his sugars and usually runs in low 100's. Compliant w Ozempic 2 mg/week and Jardiance 25 mg/d. No eercise. Diet is healthy overall. No low's.   Hypertension Patient presents for hypertension follow up. He does monitor home blood pressures. Blood pressures ranging on average from 130's/80's. He is compliant with medications- Norvasc 5 mg/d, Lotensin 40 mg/d.  Patient has these side effects of medication: none Diet/exercise as above.   BPH-history of BPH controlled on Flomax 0.4 mg daily.  Reports compliance and no adverse effects.  Urination is controlled at night.  No bleeding, discharge, pain.  Past Medical History:  Diagnosis Date   Acute meniscal tear of knee    right   Cataract of both eyes    Diabetes mellitus without complication (HCC)    Hyperlipidemia    Hypertension    Past Surgical History:  Procedure Laterality Date   CATARACT EXTRACTION, BILATERAL     CATARACT EXTRACTION, BILATERAL Bilateral 05/27/2012   COLONOSCOPY W/ POLYPECTOMY  1996 & 2014   negative 2003 (was due 2006-2008)   KNEE ARTHROSCOPY WITH MEDIAL MENISECTOMY Right 04/25/2012   ct   MOUTH SURGERY     maxillary bone graft  (ORAL SURGEON OFFICE)   ORTHOPEDIC SURGERY Right 05/10/2012   Family History  Problem Relation Age of Onset   Diabetes Mother    Hypertension Mother    Heart disease Mother        MI X4 , initially @ 102   Stroke Mother 75   Cancer Father        colon & lung   Colon cancer Father    Congestive Heart Failure Father        ? CM   Hypertension Brother    Colon cancer Brother        lung mets   Breast cancer Paternal Aunt    Prostate cancer Paternal Uncle    Hypertension Sister    Cancer Maternal Aunt    Breast cancer  Maternal Aunt    Pulmonary embolism Sister    Transient ischemic attack Sister 32   Esophageal cancer Neg Hx    Stomach cancer Neg Hx    Rectal cancer Neg Hx    No Known Allergies  Current Outpatient Medications:    amLODipine (NORVASC) 5 MG tablet, Take 5 mg by mouth daily. , Disp: , Rfl:    benazepril (LOTENSIN) 40 MG tablet, Take 40 mg by mouth daily., Disp: , Rfl:    FREESTYLE LITE test strip, , Disp: , Rfl:    furosemide (LASIX) 20 MG tablet, Take 20 mg by mouth daily., Disp: , Rfl:    JARDIANCE 25 MG TABS tablet, Take 25 mg by mouth daily., Disp: , Rfl:    Lancets (FREESTYLE) lancets, 1 each daily., Disp: , Rfl:    OZEMPIC, 0.25 OR 0.5 MG/DOSE, 2 MG/3ML SOPN, Inject 0.5 mg into the skin once a week., Disp: , Rfl:    rosuvastatin (CRESTOR) 10 MG tablet, Take 10 mg by mouth at bedtime., Disp: , Rfl:    tamsulosin (FLOMAX) 0.4 MG CAPS capsule, daily., Disp: , Rfl:   OBJECTIVE: BP 112/72 (BP Location: Left Arm, Patient Position: Sitting, Cuff Size: Normal)   Pulse 95  Temp 98.4 F (36.9 C) (Oral)   Ht 5\' 8"  (1.727 m)   Wt 196 lb 2 oz (89 kg)   SpO2 93%   BMI 29.82 kg/m  General:  well developed, well nourished, in no apparent distress Skin: Over the feet there are no significant moles, lesions, or callus buildup  Lungs:  clear to auscultation, breath sounds equal bilaterally, no respiratory distress Cardio:  regular rate and rhythm, no LE edema or bruits, DP pulses 2+ bilaterally Musculoskeletal:  symmetrical muscle groups noted without atrophy or deformity Neuro:  gait normal, sensation intact to pinprick bilaterally over the feet Psych: well oriented with normal range of affect and appropriate judgment/insight  ASSESSMENT/PLAN: Type 2 diabetes mellitus with complication, without long-term current use of insulin (HCC) - Plan: Comprehensive metabolic panel, Lipid panel, Hemoglobin A1c, Microalbumin / creatinine urine ratio  Essential hypertension  Mixed  hyperlipidemia  Encounter for hepatitis C screening test for low risk patient - Plan: Hepatitis C antibody  Thrombocytopenia (HCC)  Patient instructed to sign release of records form from their previous PCP. Chronic, hopefully stable.  Continue Jardiance 25 mg daily, Ozempic 0.5 mg weekly.  Counseled on diet and exercise. Chronic, stable.  Continue Lotensin 40 mg daily, amlodipine 5 mg daily. Chronic, stable.  Continue Crestor 10 mg daily. Check above. He will find out when his last pneumonia vaccine was.  He thinks it was in November 2023. Patient should return in 6 months for physical. The patient voiced understanding and agreement to the plan.   Jilda Roche [place], DO 06/20/22  4:47 PM

## 2022-06-21 LAB — COMPREHENSIVE METABOLIC PANEL
ALT: 15 U/L (ref 0–53)
AST: 17 U/L (ref 0–37)
Albumin: 4.7 g/dL (ref 3.5–5.2)
Alkaline Phosphatase: 64 U/L (ref 39–117)
BUN: 15 mg/dL (ref 6–23)
CO2: 28 mEq/L (ref 19–32)
Calcium: 10.1 mg/dL (ref 8.4–10.5)
Chloride: 102 mEq/L (ref 96–112)
Creatinine, Ser: 1.42 mg/dL (ref 0.40–1.50)
GFR: 49.66 mL/min — ABNORMAL LOW (ref >60.00)
Glucose, Bld: 103 mg/dL — ABNORMAL HIGH (ref 70–99)
Potassium: 3.8 mEq/L (ref 3.5–5.1)
Sodium: 139 mEq/L (ref 135–145)
Total Bilirubin: 0.7 mg/dL (ref 0.2–1.2)
Total Protein: 7.4 g/dL (ref 6.0–8.3)

## 2022-06-21 LAB — HEMOGLOBIN A1C: Hgb A1c MFr Bld: 6.3 % (ref 4.6–6.5)

## 2022-06-21 LAB — LIPID PANEL
Cholesterol: 130 mg/dL (ref 0–200)
HDL: 47.4 mg/dL (ref >39.00)
LDL Cholesterol: 69 mg/dL (ref 0–99)
NonHDL: 83.05
Total CHOL/HDL Ratio: 3
Triglycerides: 68 mg/dL (ref 0.0–149.0)
VLDL: 13.6 mg/dL (ref 0.0–40.0)

## 2022-06-21 LAB — HEPATITIS C ANTIBODY: Hepatitis C Ab: NONREACTIVE

## 2022-06-21 LAB — MICROALBUMIN / CREATININE URINE RATIO
Creatinine,U: 93.2 mg/dL
Microalb Creat Ratio: 3.1 mg/g (ref 0.0–30.0)
Microalb, Ur: 2.9 mg/dL — ABNORMAL HIGH (ref 0.0–1.9)

## 2022-08-15 ENCOUNTER — Other Ambulatory Visit: Payer: Self-pay | Admitting: Family Medicine

## 2022-08-15 MED ORDER — OZEMPIC (0.25 OR 0.5 MG/DOSE) 2 MG/3ML ~~LOC~~ SOPN: 0.5000 mg | PEN_INJECTOR | SUBCUTANEOUS | 1 refills | Status: DC

## 2022-08-15 MED ORDER — AMLODIPINE BESYLATE 5 MG PO TABS: 5.0000 mg | ORAL_TABLET | Freq: Every day | ORAL | 1 refills | Status: DC

## 2022-08-23 ENCOUNTER — Telehealth: Payer: Self-pay | Admitting: Family Medicine

## 2022-08-23 NOTE — Telephone Encounter (Signed)
Tried initiating via [email or web address], unable to verify insurance.    Ozempic PA- PA initiated via Covermymeds; KEY: [id number].  This request has been approved using information available on the patient's profile. [id number]:[id number];Review Type:Prior Auth;Coverage Start Date:07/24/2022;Coverage End Date:08/23/2023;   Jardiance PA: PA initiated via Covermymeds; KEY:  [id number]. PA approved.   [id number];UVOZDG:[id number];Review Type:Prior Auth;Coverage Start Date:07/24/2022;Coverage End Date:08/23/2023; Authorization Expiration Date: 08/22/2023

## 2022-08-23 NOTE — Telephone Encounter (Signed)
Pt has been out of ozempic for 3 weeks and states the pharmacy is requesting a PA for the ozempic and jardiance. He stated the portal for pa's has changed and the current website is [email or web address]    [facility] - [place], [place] - [address] [place] [address] Phone: [phone]  Fax: [phone]

## 2022-08-24 MED ORDER — OZEMPIC (0.25 OR 0.5 MG/DOSE) 2 MG/3ML ~~LOC~~ SOPN: 0.5000 mg | PEN_INJECTOR | SUBCUTANEOUS | 0 refills | Status: DC

## 2022-08-24 NOTE — Telephone Encounter (Signed)
Spoke to the patient and he said his insurance is updated in his chart. The patient will check with his company to see if there has been any changes.

## 2022-08-24 NOTE — Telephone Encounter (Signed)
The patient talked to his pharmacy and things are taken care of He did want a 30 day supply of Ozempic to cover him until mail order arrives. Sent to pharmacy where he now is.

## 2022-10-29 ENCOUNTER — Other Ambulatory Visit: Payer: Self-pay | Admitting: Family Medicine

## 2022-10-29 MED ORDER — JARDIANCE 25 MG PO TABS: 25.0000 mg | ORAL_TABLET | Freq: Every day | ORAL | 1 refills | Status: DC

## 2022-12-05 ENCOUNTER — Telehealth: Payer: Self-pay | Admitting: Family Medicine

## 2022-12-05 MED ORDER — BENAZEPRIL HCL 40 MG PO TABS: 40.0000 mg | ORAL_TABLET | Freq: Every day | ORAL | 3 refills | Status: DC

## 2022-12-05 NOTE — Addendum Note (Signed)
Addended by: Scharlene Gloss B on: 12/05/2022 09:42 AM   Modules accepted: Orders

## 2022-12-05 NOTE — Telephone Encounter (Signed)
Sent in prescription Called patient to inform

## 2022-12-05 NOTE — Telephone Encounter (Signed)
Prescription Request  12/05/2022  Is this a "Controlled Substance" medicine? Yes  LOV: 06/20/2022  What is the name of the medication or equipment? benazepril (LOTENSIN) 40 MG tablet  Have you contacted your pharmacy to request a refill? No   Which pharmacy would you like this sent to?   [facility] - [place], [place] - [address] [place] [address] Phone: [phone] Fax: [phone]     Patient notified that their request is being sent to the clinical staff for review and that they should receive a response within 2 business days.   Please advise at [facility] [phone]

## 2022-12-20 ENCOUNTER — Encounter: Payer: Self-pay | Admitting: Internal Medicine

## 2022-12-21 ENCOUNTER — Ambulatory Visit (INDEPENDENT_AMBULATORY_CARE_PROVIDER_SITE_OTHER): Payer: Commercial Managed Care - PPO | Admitting: Family Medicine

## 2022-12-21 ENCOUNTER — Encounter: Payer: Self-pay | Admitting: Family Medicine

## 2022-12-21 VITALS — BP 128/78 | HR 87 | Temp 98.0°F | Resp 16 | Ht 68.0 in | Wt 198.4 lb

## 2022-12-21 DIAGNOSIS — Z23 Encounter for immunization: Secondary | ICD-10-CM | POA: Diagnosis not present

## 2022-12-21 DIAGNOSIS — E118 Type 2 diabetes mellitus with unspecified complications: Secondary | ICD-10-CM | POA: Diagnosis not present

## 2022-12-21 DIAGNOSIS — Z0000 Encounter for general adult medical examination without abnormal findings: Secondary | ICD-10-CM | POA: Diagnosis not present

## 2022-12-21 MED ORDER — FUROSEMIDE 20 MG PO TABS: 20.0000 mg | ORAL_TABLET | Freq: Every day | ORAL | 2 refills | Status: DC

## 2022-12-21 MED ORDER — TAMSULOSIN HCL 0.4 MG PO CAPS: 0.4000 mg | ORAL_CAPSULE | Freq: Every day | ORAL | 2 refills | Status: DC

## 2022-12-21 MED ORDER — ROSUVASTATIN CALCIUM 10 MG PO TABS: 10.0000 mg | ORAL_TABLET | Freq: Every day | ORAL | 3 refills | Status: DC

## 2022-12-21 MED ORDER — OZEMPIC (0.25 OR 0.5 MG/DOSE) 2 MG/3ML ~~LOC~~ SOPN: 0.5000 mg | PEN_INJECTOR | SUBCUTANEOUS | 2 refills | Status: DC

## 2022-12-21 NOTE — Progress Notes (Signed)
Chief Complaint  Patient presents with   Annual Exam    Annual Exam    Well Male Bryce Hurst is here for a complete physical.   His last physical was >1 year ago.  Current diet: in general, a "healthy" diet.   Current exercise: walking Weight trend: stable Fatigue out of ordinary? No. Seat belt? Yes.   Advanced directive?  Yes  Health maintenance Shingrix- Yes Colonoscopy- Yes Tetanus- Yes Hep C- Yes Pneumonia vaccine- Yes  Past Medical History:  Diagnosis Date   Acute meniscal tear of knee    right   Cataract of both eyes    Diabetes mellitus without complication (HCC)    Hyperlipidemia    Hypertension      Past Surgical History:  Procedure Laterality Date   CATARACT EXTRACTION, BILATERAL     CATARACT EXTRACTION, BILATERAL Bilateral 05/27/2012   COLONOSCOPY W/ POLYPECTOMY  1996 & 2014   negative 2003 (was due 2006-2008)   KNEE ARTHROSCOPY WITH MEDIAL MENISECTOMY Right 04/25/2012   ct   MOUTH SURGERY     maxillary bone graft  (ORAL SURGEON OFFICE)   ORTHOPEDIC SURGERY Right 05/10/2012    Medications  Current Outpatient Medications on File Prior to Visit  Medication Sig Dispense Refill   amLODipine (NORVASC) 5 MG tablet Take 1 tablet (5 mg total) by mouth daily. 90 tablet 1   benazepril (LOTENSIN) 40 MG tablet Take 1 tablet (40 mg total) by mouth daily. 90 tablet 3   FREESTYLE LITE test strip      furosemide (LASIX) 20 MG tablet Take 20 mg by mouth daily.     JARDIANCE 25 MG TABS tablet Take 1 tablet (25 mg total) by mouth daily. 90 tablet 1   Lancets (FREESTYLE) lancets 1 each daily.     OZEMPIC, 0.25 OR 0.5 MG/DOSE, 2 MG/3ML SOPN Inject 0.5 mg into the skin once a week. 3 mL 0   rosuvastatin (CRESTOR) 10 MG tablet Take 10 mg by mouth at bedtime.     tamsulosin (FLOMAX) 0.4 MG CAPS capsule daily.      Allergies No Known Allergies  Family History Family History  Problem Relation Age of Onset   Diabetes Mother    Hypertension Mother    Heart disease  Mother        MI X4 , initially @ 6   Stroke Mother 31   Cancer Father        colon & lung   Colon cancer Father    Congestive Heart Failure Father        ? CM   Hypertension Brother    Colon cancer Brother        lung mets   Breast cancer Paternal Aunt    Prostate cancer Paternal Uncle    Hypertension Sister    Cancer Maternal Aunt    Breast cancer Maternal Aunt    Pulmonary embolism Sister    Transient ischemic attack Sister 48   Esophageal cancer Neg Hx    Stomach cancer Neg Hx    Rectal cancer Neg Hx     Review of Systems: Constitutional:  no fevers Eye:  no recent significant change in vision Ears:  No changes in hearing Nose/Mouth/Throat:  no complaints of nasal congestion, no sore throat Cardiovascular: no chest pain Respiratory:  No shortness of breath Gastrointestinal:  No change in bowel habits GU:  No frequency Integumentary:  no abnormal skin lesions reported Neurologic:  no headaches Endocrine:  denies unexplained weight changes  Exam BP 128/78 (BP Location: Left Arm, Patient Position: Sitting, Cuff Size: Normal)   Pulse 87   Temp 98 F (36.7 C) (Oral)   Resp 16   Ht 5\' 8"  (1.727 m)   Wt 198 lb 6.4 oz (90 kg)   SpO2 96%   BMI 30.17 kg/m  General:  well developed, well nourished, in no apparent distress Skin:  no significant moles, warts, or growths Head:  no masses, lesions, or tenderness Eyes:  pupils equal and round, sclera anicteric without injection Ears:  canals without lesions, TMs shiny without retraction, no obvious effusion, no erythema Nose:  nares patent, mucosa normal Throat/Pharynx:  lips and gingiva without lesion; tongue and uvula midline; non-inflamed pharynx; no exudates or postnasal drainage Lungs:  clear to auscultation, breath sounds equal bilaterally, no respiratory distress Cardio:  regular rate and rhythm, no LE edema or bruits Rectal: Deferred GI: BS+, S, NT, ND, no masses or organomegaly Musculoskeletal:  symmetrical  muscle groups noted without atrophy or deformity Neuro:  gait normal; deep tendon reflexes normal and symmetric Psych: well oriented with normal range of affect and appropriate judgment/insight  Assessment and Plan  Well adult exam  Type 2 diabetes mellitus with complication, without long-term current use of insulin (HCC) - Plan: CBC, Comprehensive metabolic panel, Hemoglobin A1c, Lipid panel  Well 72 y.o. male. Counseled on diet and exercise.  Add some weight resistance exercise. Advanced directive form requested today.  Flu shot today.  Other orders as above. Follow up in 6 mo.  The patient voiced understanding and agreement to the plan.  Jilda Roche [place], DO 12/21/22 1:41 PM

## 2022-12-21 NOTE — Patient Instructions (Addendum)
Give us 2-3 business days to get the results of your labs back.   Keep the diet clean and stay active.  Please consider adding some weight resistance exercise to your routine. Consider yoga as well.   Please get me a copy of your advanced directive form at your convenience.   Let us know if you need anything.  

## 2022-12-22 LAB — COMPREHENSIVE METABOLIC PANEL
AG Ratio: 1.8 (calc) (ref 1.0–2.5)
ALT: 16 U/L (ref 9–46)
AST: 19 U/L (ref 10–35)
Albumin: 4.7 g/dL (ref 3.6–5.1)
Alkaline phosphatase (APISO): 62 U/L (ref 35–144)
BUN/Creatinine Ratio: 13 (calc) (ref 6–22)
BUN: 20 mg/dL (ref 7–25)
CO2: 23 mmol/L (ref 20–32)
Calcium: 10.3 mg/dL (ref 8.6–10.3)
Chloride: 103 mmol/L (ref 98–110)
Creat: 1.58 mg/dL — ABNORMAL HIGH (ref 0.70–1.28)
Globulin: 2.6 g/dL (ref 1.9–3.7)
Glucose, Bld: 72 mg/dL (ref 65–99)
Potassium: 4.2 mmol/L (ref 3.5–5.3)
Sodium: 139 mmol/L (ref 135–146)
Total Bilirubin: 0.7 mg/dL (ref 0.2–1.2)
Total Protein: 7.3 g/dL (ref 6.1–8.1)

## 2022-12-22 LAB — LIPID PANEL
Cholesterol: 121 mg/dL (ref <200)
HDL: 38 mg/dL — ABNORMAL LOW (ref >=40)
LDL Cholesterol (Calc): 65 mg/dL
Non-HDL Cholesterol (Calc): 83 mg/dL (ref <130)
Total CHOL/HDL Ratio: 3.2 (calc) (ref <5.0)
Triglycerides: 97 mg/dL (ref <150)

## 2022-12-22 LAB — CBC
HCT: 47.9 % (ref 38.5–50.0)
Hemoglobin: 16.1 g/dL (ref 13.2–17.1)
MCH: 31 pg (ref 27.0–33.0)
MCHC: 33.6 g/dL (ref 32.0–36.0)
MCV: 92.1 fL (ref 80.0–100.0)
MPV: 12.1 fL (ref 7.5–12.5)
Platelets: 181 10*3/uL (ref 140–400)
RBC: 5.2 10*6/uL (ref 4.20–5.80)
RDW: 12.4 % (ref 11.0–15.0)
WBC: 5.1 10*3/uL (ref 3.8–10.8)

## 2022-12-22 LAB — HEMOGLOBIN A1C
Hgb A1c MFr Bld: 6.7 %{Hb} — ABNORMAL HIGH (ref <5.7)
Mean Plasma Glucose: 146 mg/dL
eAG (mmol/L): 8.1 mmol/L

## 2022-12-24 ENCOUNTER — Other Ambulatory Visit: Payer: Self-pay

## 2022-12-24 DIAGNOSIS — E118 Type 2 diabetes mellitus with unspecified complications: Secondary | ICD-10-CM

## 2022-12-28 ENCOUNTER — Other Ambulatory Visit: Payer: Commercial Managed Care - PPO

## 2022-12-28 ENCOUNTER — Other Ambulatory Visit (INDEPENDENT_AMBULATORY_CARE_PROVIDER_SITE_OTHER): Payer: Commercial Managed Care - PPO

## 2022-12-28 DIAGNOSIS — E118 Type 2 diabetes mellitus with unspecified complications: Secondary | ICD-10-CM | POA: Diagnosis not present

## 2022-12-29 LAB — BASIC METABOLIC PANEL
BUN/Creatinine Ratio: 13 (calc) (ref 6–22)
BUN: 21 mg/dL (ref 7–25)
CO2: 28 mmol/L (ref 20–32)
Calcium: 10 mg/dL (ref 8.6–10.3)
Chloride: 104 mmol/L (ref 98–110)
Creat: 1.6 mg/dL — ABNORMAL HIGH (ref 0.70–1.28)
Glucose, Bld: 100 mg/dL — ABNORMAL HIGH (ref 65–99)
Potassium: 4.5 mmol/L (ref 3.5–5.3)
Sodium: 140 mmol/L (ref 135–146)

## 2022-12-31 ENCOUNTER — Encounter: Payer: Self-pay | Admitting: Family Medicine

## 2023-01-03 ENCOUNTER — Other Ambulatory Visit: Payer: Self-pay | Admitting: Family Medicine

## 2023-01-03 ENCOUNTER — Encounter: Payer: Self-pay | Admitting: Internal Medicine

## 2023-01-03 DIAGNOSIS — I10 Essential (primary) hypertension: Secondary | ICD-10-CM

## 2023-01-03 NOTE — Telephone Encounter (Signed)
Please advise pt once order are on file so he can get scheduled.

## 2023-02-11 ENCOUNTER — Ambulatory Visit (AMBULATORY_SURGERY_CENTER): Payer: Commercial Managed Care - PPO

## 2023-02-11 VITALS — Ht 68.0 in | Wt 198.0 lb

## 2023-02-11 DIAGNOSIS — Z1211 Encounter for screening for malignant neoplasm of colon: Secondary | ICD-10-CM

## 2023-02-11 MED ORDER — NA SULFATE-K SULFATE-MG SULF 17.5-3.13-1.6 GM/177ML PO SOLN: 1.0000 | Freq: Once | ORAL | 0 refills | Status: AC

## 2023-02-11 NOTE — Progress Notes (Signed)

## 2023-02-28 ENCOUNTER — Telehealth: Payer: Self-pay | Admitting: Internal Medicine

## 2023-02-28 DIAGNOSIS — Z860100 Personal history of colon polyps, unspecified: Secondary | ICD-10-CM

## 2023-02-28 NOTE — Telephone Encounter (Signed)
New prep instructions sent hard copy will be mailed to address on file

## 2023-02-28 NOTE — Telephone Encounter (Signed)
PT is calling to have updated instructions sent to mychart. Please advise.

## 2023-03-13 ENCOUNTER — Encounter: Payer: Commercial Managed Care - PPO | Admitting: Internal Medicine

## 2023-04-05 ENCOUNTER — Encounter: Payer: Self-pay | Admitting: Family Medicine

## 2023-04-05 ENCOUNTER — Other Ambulatory Visit (INDEPENDENT_AMBULATORY_CARE_PROVIDER_SITE_OTHER): Payer: Commercial Managed Care - PPO

## 2023-04-05 DIAGNOSIS — I10 Essential (primary) hypertension: Secondary | ICD-10-CM | POA: Diagnosis not present

## 2023-04-05 LAB — BASIC METABOLIC PANEL
BUN: 16 mg/dL (ref 6–23)
CO2: 30 meq/L (ref 19–32)
Calcium: 9.5 mg/dL (ref 8.4–10.5)
Chloride: 100 meq/L (ref 96–112)
Creatinine, Ser: 1.43 mg/dL (ref 0.40–1.50)
GFR: 48.98 mL/min — ABNORMAL LOW (ref >60.00)
Glucose, Bld: 151 mg/dL — ABNORMAL HIGH (ref 70–99)
Potassium: 4.1 meq/L (ref 3.5–5.1)
Sodium: 137 meq/L (ref 135–145)

## 2023-04-05 MED ORDER — NA SULFATE-K SULFATE-MG SULF 17.5-3.13-1.6 GM/177ML PO SOLN: 1.0000 | Freq: Once | ORAL | 0 refills | Status: AC

## 2023-04-05 NOTE — Addendum Note (Signed)
 Addended by: Danton Sewer on: 04/05/2023 10:44 AM   Modules accepted: Orders

## 2023-04-05 NOTE — Telephone Encounter (Signed)
 New prep instructions sent hard copy will be mailed to address on file

## 2023-04-05 NOTE — Telephone Encounter (Signed)
 Patient calling in regards to prep pease advise.   Thank you

## 2023-04-09 ENCOUNTER — Encounter: Payer: Self-pay | Admitting: Internal Medicine

## 2023-04-12 ENCOUNTER — Ambulatory Visit: Payer: Commercial Managed Care - PPO | Admitting: Internal Medicine

## 2023-04-12 ENCOUNTER — Encounter: Payer: Self-pay | Admitting: Internal Medicine

## 2023-04-12 VITALS — BP 148/84 | HR 69 | Temp 97.7°F | Resp 13 | Ht 68.0 in | Wt 198.0 lb

## 2023-04-12 DIAGNOSIS — K5730 Diverticulosis of large intestine without perforation or abscess without bleeding: Secondary | ICD-10-CM | POA: Diagnosis not present

## 2023-04-12 DIAGNOSIS — K648 Other hemorrhoids: Secondary | ICD-10-CM

## 2023-04-12 DIAGNOSIS — D123 Benign neoplasm of transverse colon: Secondary | ICD-10-CM

## 2023-04-12 DIAGNOSIS — Z1211 Encounter for screening for malignant neoplasm of colon: Secondary | ICD-10-CM

## 2023-04-12 DIAGNOSIS — D122 Benign neoplasm of ascending colon: Secondary | ICD-10-CM | POA: Diagnosis not present

## 2023-04-12 DIAGNOSIS — Z800 Family history of malignant neoplasm of digestive organs: Secondary | ICD-10-CM

## 2023-04-12 MED ORDER — SODIUM CHLORIDE 0.9 % IV SOLN: 500.0000 mL | Freq: Once | INTRAVENOUS | Status: DC

## 2023-04-12 NOTE — Progress Notes (Signed)
 Pt's states no medical or surgical changes since previsit or office visit.

## 2023-04-12 NOTE — Progress Notes (Signed)
 Called to room to assist during endoscopic procedure.  Patient ID and intended procedure confirmed with present staff. Received instructions for my participation in the procedure from the performing physician.

## 2023-04-12 NOTE — Progress Notes (Signed)
 To pacu, VSS. Report to [email or web address]

## 2023-04-12 NOTE — Op Note (Signed)
 [facility] Patient Name: Bryce Hurst Procedure Date: 04/12/2023 2:19 PM MRN: [id number] Endoscopist: Particia Lather , , [id number] Age: 73 Referring MD:  Date of Birth: 1950-07-24 Gender: Male Account #: [id number] Procedure:                Colonoscopy Indications:              Family history of colon cancer in multiple                            first-degree relatives Medicines:                Monitored Anesthesia Care Procedure:                Pre-Anesthesia Assessment:                           - Prior to the procedure, a History and Physical                            was performed, and patient medications and                            allergies were reviewed. The patient's tolerance of                            previous anesthesia was also reviewed. The risks                            and benefits of the procedure and the sedation                            options and risks were discussed with the patient.                            All questions were answered, and informed consent                            was obtained. Prior Anticoagulants: The patient has                            taken no anticoagulant or antiplatelet agents. ASA                            Grade Assessment: III - A patient with severe                            systemic disease. After reviewing the risks and                            benefits, the patient was deemed in satisfactory                            condition to undergo the procedure.  After obtaining informed consent, the colonoscope                            was passed under direct vision. Throughout the                            procedure, the patient's blood pressure, pulse, and                            oxygen saturations were monitored continuously. The                            CF [id number] #[id number] was introduced through the anus                            and advanced to the the cecum,  identified by                            appendiceal orifice and ileocecal valve. The                            colonoscopy was performed without difficulty. The                            patient tolerated the procedure well. The quality                            of the bowel preparation was good. The ileocecal                            valve, appendiceal orifice, and rectum were                            photographed. Scope In: 2:25:21 PM Scope Out: 2:46:16 PM Scope Withdrawal Time: 0 hours 15 minutes 0 seconds  Total Procedure Duration: 0 hours 20 minutes 55 seconds  Findings:                 Three sessile polyps were found in the transverse                            colon and ascending colon. The polyps were 3 to 6                            mm in size. These polyps were removed with a cold                            snare. Resection and retrieval were complete.                           A few diverticula were found in the sigmoid colon.                           Non-bleeding internal hemorrhoids were found  during                            retroflexion. Complications:            No immediate complications. Estimated Blood Loss:     Estimated blood loss was minimal. Impression:               - Three 3 to 6 mm polyps in the transverse colon                            and in the ascending colon, removed with a cold                            snare. Resected and retrieved.                           - Diverticulosis in the sigmoid colon.                           - Non-bleeding internal hemorrhoids. Recommendation:           - Discharge patient to home (with escort).                           - Await pathology results.                           - The findings and recommendations were discussed                            with the patient. Dr Particia Lather "[address]" [place],  04/12/2023 2:49:57 PM

## 2023-04-12 NOTE — Patient Instructions (Addendum)
 Await pathology  Please read over handouts provided Continue your normal medications   YOU HAD AN ENDOSCOPIC PROCEDURE TODAY AT THE [facility]:   Refer to the procedure report that was given to you for any specific questions about what was found during the examination.  If the procedure report does not answer your questions, please call your gastroenterologist to clarify.  If you requested that your care partner not be given the details of your procedure findings, then the procedure report has been included in a sealed envelope for you to review at your convenience later.  YOU SHOULD EXPECT: Some feelings of bloating in the abdomen. Passage of more gas than usual.  Walking can help get rid of the air that was put into your GI tract during the procedure and reduce the bloating. If you had a lower endoscopy (such as a colonoscopy or flexible sigmoidoscopy) you may notice spotting of blood in your stool or on the toilet paper. If you underwent a bowel prep for your procedure, you may not have a normal bowel movement for a few days.  Please Note:  You might notice some irritation and congestion in your nose or some drainage.  This is from the oxygen used during your procedure.  There is no need for concern and it should clear up in a day or so.  SYMPTOMS TO REPORT IMMEDIATELY:  Following lower endoscopy (colonoscopy or flexible sigmoidoscopy):  Excessive amounts of blood in the stool  Significant tenderness or worsening of abdominal pains  Swelling of the abdomen that is new, acute  Fever of 100F or higher For urgent or emergent issues, a gastroenterologist can be reached at any hour by calling [phone]. Do not use MyChart messaging for urgent concerns.    DIET:  We do recommend a small meal at first, but then you may proceed to your regular diet.  Drink plenty of fluids but you should avoid alcoholic beverages for 24 hours.  ACTIVITY:  You should plan to take it easy for  the rest of today and you should NOT DRIVE or use heavy machinery until tomorrow (because of the sedation medicines used during the test).    FOLLOW UP: Our staff will call the number listed on your records the next business day following your procedure.  We will call around 7:15- 8:00 am to check on you and address any questions or concerns that you may have regarding the information given to you following your procedure. If we do not reach you, we will leave a message.     If any biopsies were taken you will be contacted by phone or by letter within the next 1-3 weeks.  Please call us at [phone] if you have not heard about the biopsies in 3 weeks.    SIGNATURES/CONFIDENTIALITY: You and/or your care partner have signed paperwork which will be entered into your electronic medical record.  These signatures attest to the fact that that the information above on your After Visit Summary has been reviewed and is understood.  Full responsibility of the confidentiality of this discharge information lies with you and/or your care-partner.

## 2023-04-12 NOTE — Progress Notes (Signed)
 GASTROENTEROLOGY PROCEDURE H&P NOTE   Primary Care Physician: Sharlene Dory, DO    Reason for Procedure:  Family history of colon cancer  Plan:    Colonoscopy  Patient is appropriate for endoscopic procedure(s) in the ambulatory ([facility]) setting.  The nature of the procedure, as well as the risks, benefits, and alternatives were carefully and thoroughly reviewed with the patient. Ample time for discussion and questions allowed. The patient understood, was satisfied, and agreed to proceed.     HPI: Bryce Hurst is a 73 y.o. male who presents for colonoscopy for family history of colon cancer. Denies blood in stools, changes in bowel habits, or unintentional weight loss. Brother had colon cancer at age 44 and father had colon cancer in his 66s. Patient last had a colonoscopy 5 years ago at another practice with some colon polyps.  Colonoscopy 07/11/12: Two 3-5 mm sessile polyps in the sigmoid colon. Path: HPs   Past Medical History:  Diagnosis Date   Acute meniscal tear of knee    right   Cataract of both eyes    Diabetes mellitus without complication (HCC)    Hyperlipidemia    Hypertension     Past Surgical History:  Procedure Laterality Date   CATARACT EXTRACTION, BILATERAL     CATARACT EXTRACTION, BILATERAL Bilateral 05/27/2012   COLONOSCOPY W/ POLYPECTOMY  1996 & 2014   negative 2003 (was due 2006-2008)   KNEE ARTHROSCOPY WITH MEDIAL MENISECTOMY Right 04/25/2012   ct   MOUTH SURGERY     maxillary bone graft  (ORAL SURGEON OFFICE)   ORTHOPEDIC SURGERY Right 05/10/2012    Prior to Admission medications   Medication Sig Start Date End Date Taking? Authorizing Provider  benazepril (LOTENSIN) 40 MG tablet Take 1 tablet (40 mg total) by mouth daily. 12/05/22  Yes Sharlene Dory, DO  FREESTYLE LITE test strip  03/27/22  Yes [provider]  JARDIANCE 25 MG TABS tablet Take 1 tablet (25 mg total) by mouth daily. 10/29/22  Yes Sharlene Dory, DO  Lancets (FREESTYLE) lancets 1 each daily. 03/27/22  Yes [provider]  rosuvastatin (CRESTOR) 10 MG tablet Take 1 tablet (10 mg total) by mouth at bedtime. 12/21/22  Yes Sharlene Dory, DO  fluticasone (FLONASE) 50 MCG/ACT nasal spray Spray 1 spray every day by intranasal route.    [provider]  furosemide (LASIX) 20 MG tablet Take 1 tablet (20 mg total) by mouth daily. 12/21/22   Wendling, Jilda Roche, DO  OZEMPIC, 0.25 OR 0.5 MG/DOSE, 2 MG/3ML SOPN Inject 0.5 mg into the skin once a week. 12/21/22   Sharlene Dory, DO  tamsulosin (FLOMAX) 0.4 MG CAPS capsule Take 1 capsule (0.4 mg total) by mouth daily. 12/21/22   Sharlene Dory, DO    Current Outpatient Medications  Medication Sig Dispense Refill   benazepril (LOTENSIN) 40 MG tablet Take 1 tablet (40 mg total) by mouth daily. 90 tablet 3   FREESTYLE LITE test strip      JARDIANCE 25 MG TABS tablet Take 1 tablet (25 mg total) by mouth daily. 90 tablet 1   Lancets (FREESTYLE) lancets 1 each daily.     rosuvastatin (CRESTOR) 10 MG tablet Take 1 tablet (10 mg total) by mouth at bedtime. 90 tablet 3   fluticasone (FLONASE) 50 MCG/ACT nasal spray Spray 1 spray every day by intranasal route.     furosemide (LASIX) 20 MG tablet Take 1 tablet (20 mg total) by mouth daily. 90  tablet 2   OZEMPIC, 0.25 OR 0.5 MG/DOSE, 2 MG/3ML SOPN Inject 0.5 mg into the skin once a week. 9 mL 2   tamsulosin (FLOMAX) 0.4 MG CAPS capsule Take 1 capsule (0.4 mg total) by mouth daily. 90 capsule 2   Current Facility-Administered Medications  Medication Dose Route Frequency Provider Last Rate Last Admin   0.9 %  sodium chloride infusion  500 mL Intravenous Once Imogene Burn, MD        Allergies as of 04/12/2023   (No Known Allergies)    Family History  Problem Relation Age of Onset   Diabetes Mother    Hypertension Mother    Heart disease Mother        MI X4 , initially @ 37   Stroke Mother 7    Cancer Father        colon & lung   Colon cancer Father    Congestive Heart Failure Father        ? CM   Hypertension Sister    Pulmonary embolism Sister    Transient ischemic attack Sister 64   Hypertension Brother    Colon cancer Brother        lung mets   Colon cancer Brother    Cancer Maternal Aunt    Breast cancer Maternal Aunt    Breast cancer Paternal Aunt    Prostate cancer Paternal Uncle    Esophageal cancer Neg Hx    Stomach cancer Neg Hx    Rectal cancer Neg Hx    Colon polyps Neg Hx     Social History   Socioeconomic History   Marital status: Married    Spouse name: Not on file   Number of children: 3   Years of education: Not on file   Highest education level: Not on file  Occupational History   Not on file  Tobacco Use   Smoking status: Never   Smokeless tobacco: Never  Vaping Use   Vaping status: Never Used  Substance and Sexual Activity   Alcohol use: No   Drug use: No   Sexual activity: Not Currently  Other Topics Concern   Not on file  Social History Narrative   Not on file   Social Drivers of Health   Financial Resource Strain: Not on file  Food Insecurity: Not on file  Transportation Needs: Not on file  Physical Activity: Not on file  Stress: Not on file  Social Connections: Not on file  Intimate Partner Violence: Not on file    Physical Exam: Vital signs in last 24 hours: BP (!) 151/89   Pulse 74   Temp 97.7 F (36.5 C) (Temporal)   Ht 5\' 8"  (1.727 m)   Wt 198 lb (89.8 kg)   SpO2 99%   BMI 30.11 kg/m  GEN: NAD EYE: Sclerae anicteric ENT: MMM CV: Non-tachycardic Pulm: No increased work of breathing GI: Soft, NT/ND NEURO:  Alert & Oriented   Eulah Pont, MD [facility]  04/12/2023 2:10 PM

## 2023-04-15 ENCOUNTER — Telehealth: Payer: Self-pay

## 2023-04-15 NOTE — Telephone Encounter (Signed)
  Follow up Call-     04/12/2023    1:24 PM  Call back number  Post procedure Call Back phone  # [phone]  Permission to leave phone message Yes     Patient questions:  Do you have a fever, pain , or abdominal swelling? No. Pain Score  0 *  Have you tolerated food without any problems? Yes.    Have you been able to return to your normal activities? Yes.    Do you have any questions about your discharge instructions: Diet   No. Medications  No. Follow up visit  No.  Do you have questions or concerns about your Care? No.  Actions: * If pain score is 4 or above: No action needed, pain <4.

## 2023-04-17 LAB — SURGICAL PATHOLOGY

## 2023-04-23 ENCOUNTER — Encounter: Payer: Self-pay | Admitting: Internal Medicine

## 2023-04-24 LAB — HM DIABETES EYE EXAM

## 2023-04-25 ENCOUNTER — Other Ambulatory Visit: Payer: Self-pay | Admitting: Family Medicine

## 2023-04-25 NOTE — Telephone Encounter (Signed)
 Copied from CRM [id number]. Topic: Clinical - Medication Refill >> Apr 25, 2023 11:23 AM Florestine Avers wrote: Most Recent Primary Care Visit:  Provider: [facility]  [facility]  Visit Type: LAB VISIT  Date: 04/05/2023  Medication: FREESTYLE LITE test strip, Lancets (FREESTYLE) lancets  Has the patient contacted their pharmacy? Yes (Agent: If no, request that the patient contact the pharmacy for the refill. If patient does not wish to contact the pharmacy document the reason why and proceed with request.) (Agent: If yes, when and what did the pharmacy advise?)  Is this the correct pharmacy for this prescription? Yes If no, delete pharmacy and type the correct one.  This is the patient's preferred pharmacy:  [facility] - [place], [place] - [address] [place] [address] Phone: [phone] Fax: [phone]  [facility] [address] [place] - [address] [place] [address] Phone: [phone] Fax: [phone]  [facility] [address] [place], [place] - [address] [place] [phone] Phone: [phone] Fax: [phone]   Has the prescription been filled recently? No  Is the patient out of the medication? Yes  Has the patient been seen for an appointment in the last year OR does the patient have an upcoming appointment? Yes  Can we respond through MyChart? Yes  Agent: Please be advised that Rx refills may take up to 3 business days. We ask that you follow-up with your pharmacy.

## 2023-05-20 ENCOUNTER — Other Ambulatory Visit: Payer: Self-pay | Admitting: Family Medicine

## 2023-05-20 NOTE — Telephone Encounter (Signed)
 Patient is upset that this has not yet been completed. Has been requesting for over a month. If unable to complete please update patient. Thank You   Copied from CRM [id number]. Topic: Clinical - Medication Refill >> May 20, 2023 12:21 PM Fredrica W wrote: Most Recent Primary Care Visit:  Provider: [facility]  [facility]  Visit Type: LAB VISIT  Date: 04/05/2023  Medication: originally requested 3/13 no response  FREESTYLE LITE test strip, Lancets (FREESTYLE) lancets  Has the patient contacted their pharmacy? Yes (Agent: If no, request that the patient contact the pharmacy for the refill. If patient does not wish to contact the pharmacy document the reason why and proceed with request.) (Agent: If yes, when and what did the pharmacy advise?) no response from provider   Is this the correct pharmacy for this prescription? Yes If no, delete pharmacy and type the correct one.  This is the patient's preferred pharmacy:  [facility] - [place], [place] - [address] [place] [address] Phone: [phone] Fax: [phone]   Has the prescription been filled recently? No  Is the patient out of the medication? Yes  Has the patient been seen for an appointment in the last year OR does the patient have an upcoming appointment? Yes  Can we respond through MyChart? Yes  Agent: Please be advised that Rx refills may take up to 3 business days. We ask that you follow-up with your pharmacy.

## 2023-05-21 MED ORDER — FREESTYLE LANCETS MISC
12 refills | Status: DC
  Filled 2023-08-14: qty 100, 90d supply, fill #0

## 2023-05-21 MED ORDER — FREESTYLE LITE TEST VI STRP
ORAL_STRIP | 12 refills | Status: DC
  Filled 2023-08-14: qty 100, 90d supply, fill #0

## 2023-06-20 ENCOUNTER — Other Ambulatory Visit: Payer: Self-pay | Admitting: Family Medicine

## 2023-06-20 MED ORDER — OZEMPIC (0.25 OR 0.5 MG/DOSE) 2 MG/3ML ~~LOC~~ SOPN: 0.5000 mg | PEN_INJECTOR | SUBCUTANEOUS | 0 refills | Status: DC

## 2023-06-20 MED ORDER — ROSUVASTATIN CALCIUM 10 MG PO TABS: 10.0000 mg | ORAL_TABLET | Freq: Every day | ORAL | 0 refills | Status: DC

## 2023-06-20 MED ORDER — BENAZEPRIL HCL 40 MG PO TABS: 40.0000 mg | ORAL_TABLET | Freq: Every day | ORAL | 0 refills | Status: DC

## 2023-06-20 MED ORDER — TAMSULOSIN HCL 0.4 MG PO CAPS: 0.4000 mg | ORAL_CAPSULE | Freq: Every day | ORAL | 0 refills | Status: DC

## 2023-06-20 MED ORDER — FUROSEMIDE 20 MG PO TABS: 20.0000 mg | ORAL_TABLET | Freq: Every day | ORAL | 0 refills | Status: DC

## 2023-06-20 MED ORDER — JARDIANCE 25 MG PO TABS: 25.0000 mg | ORAL_TABLET | Freq: Every day | ORAL | 0 refills | Status: DC

## 2023-06-20 NOTE — Telephone Encounter (Signed)
 Refills available on Freestyle Lite test strips and Freestyle Lancets

## 2023-06-20 NOTE — Telephone Encounter (Signed)
 Copied from CRM [id number]. Topic: Clinical - Medication Refill >> Jun 20, 2023  9:52 AM Tanazia G wrote: Medication:  benazepril  (LOTENSIN ) 40 MG tablet fluticasone (FLONASE) 50 MCG/ACT nasal spray FREESTYLE LITE test strip tamsulosin  (FLOMAX ) 0.4 MG CAPS capsule  furosemide  (LASIX ) 20 MG tablet JARDIANCE  25 MG TABS tablet Lancets (FREESTYLE) lancets OZEMPIC , 0.25 OR 0.5 MG/DOSE, 2 MG/3ML SOPN rosuvastatin  (CRESTOR ) 10 MG tablet     Has the patient contacted their pharmacy? Yes (Agent: If no, request that the patient contact the pharmacy for the refill. If patient does not wish to contact the pharmacy document the reason why and proceed with request.) (Agent: If yes, when and what did the pharmacy advise?)  This is the patient's preferred pharmacy:  [facility] - [place], [place] - [address] [place] [address] Phone: [phone] Fax: [phone]  [facility] [address], [place] - [address] [place] [address] Phone: [phone] Fax: [phone]  [facility] [address] [place], [place] - [address] [place] [phone] Phone: [phone] Fax: [phone]  Is this the correct pharmacy for this prescription? Yes If no, delete pharmacy and type the correct one.   Has the prescription been filled recently? No  Is the patient out of the medication? Yes  Has the patient been seen for an appointment in the last year OR does the patient have an upcoming appointment? Yes  Can we respond through MyChart? Yes  Agent: Please be advised that Rx refills may take up to 3 business days. We ask that you follow-up with your pharmacy.

## 2023-07-12 ENCOUNTER — Other Ambulatory Visit: Payer: Self-pay | Admitting: Family Medicine

## 2023-07-26 ENCOUNTER — Other Ambulatory Visit: Payer: Self-pay | Admitting: Family Medicine

## 2023-07-26 NOTE — Telephone Encounter (Unsigned)
 Copied from CRM [id number]. Topic: Clinical - Medication Refill >> Jul 26, 2023  3:39 PM Marlan Silva wrote: Medication:  benazepril  (LOTENSIN ) 40 MG tablet  Has the patient contacted their pharmacy? Yes (Agent: If no, request that the patient contact the pharmacy for the refill. If patient does not wish to contact the pharmacy document the reason why and proceed with request.) (Agent: If yes, when and what did the pharmacy advise?)  This is the patient's preferred pharmacy:  [facility] - [place], [place] - [address] [place] [address]  Is this the correct pharmacy for this prescription? Yes If no, delete pharmacy and type the correct one.   Has the prescription been filled recently? Yes  Is the patient out of the medication? Yes  Has the patient been seen for an appointment in the last year OR does the patient have an upcoming appointment? Yes  Can we respond through MyChart? Yes  Agent: Please be advised that Rx refills may take up to 3 business days. We ask that you follow-up with your pharmacy.

## 2023-07-29 NOTE — Telephone Encounter (Signed)
Pt overdue for visit. 

## 2023-08-12 ENCOUNTER — Telehealth: Payer: Self-pay

## 2023-08-12 MED ORDER — BENAZEPRIL HCL 40 MG PO TABS: 40.0000 mg | ORAL_TABLET | Freq: Every day | ORAL | 0 refills | Status: DC

## 2023-08-12 NOTE — Telephone Encounter (Signed)
 Copied from CRM [id number]. Topic: Clinical - Medication Question >> Aug 12, 2023  3:23 PM Mesmerise C wrote: Reason for CRM: Patient is requesting his medication Benazepril  [facility] is almost out of medication an appointment has been made for 7/14

## 2023-08-13 ENCOUNTER — Other Ambulatory Visit (HOSPITAL_COMMUNITY): Payer: Self-pay

## 2023-08-14 ENCOUNTER — Other Ambulatory Visit: Payer: Self-pay

## 2023-08-14 ENCOUNTER — Other Ambulatory Visit (HOSPITAL_COMMUNITY): Payer: Self-pay

## 2023-08-14 MED ORDER — FUROSEMIDE 20 MG PO TABS
20.0000 mg | ORAL_TABLET | Freq: Every day | ORAL | 2 refills | Status: DC
  Filled 2023-08-14: qty 30, 30d supply, fill #0

## 2023-08-14 MED ORDER — AMLODIPINE BESYLATE 5 MG PO TABS
5.0000 mg | ORAL_TABLET | Freq: Every day | ORAL | 1 refills | Status: DC
  Filled 2023-08-14: qty 30, 30d supply, fill #0

## 2023-08-14 MED ORDER — ROSUVASTATIN CALCIUM 10 MG PO TABS
10.0000 mg | ORAL_TABLET | Freq: Every evening | ORAL | 3 refills | Status: DC
  Filled 2023-08-14: qty 30, 30d supply, fill #0

## 2023-08-14 MED ORDER — BLOOD GLUCOSE MONITOR KIT
PACK | 0 refills | Status: DC
  Filled 2023-08-14: qty 1, fill #0

## 2023-08-14 MED FILL — Empagliflozin Tab 25 MG: ORAL | 30 days supply | Qty: 30 | Fill #0 | Status: AC

## 2023-08-15 ENCOUNTER — Other Ambulatory Visit (HOSPITAL_COMMUNITY): Payer: Self-pay

## 2023-08-15 ENCOUNTER — Other Ambulatory Visit: Payer: Self-pay

## 2023-08-20 ENCOUNTER — Other Ambulatory Visit (HOSPITAL_COMMUNITY): Payer: Self-pay

## 2023-08-20 ENCOUNTER — Other Ambulatory Visit: Payer: Self-pay

## 2023-08-20 ENCOUNTER — Other Ambulatory Visit: Payer: Self-pay | Admitting: Family Medicine

## 2023-08-20 MED ORDER — AMLODIPINE BESYLATE 5 MG PO TABS
5.0000 mg | ORAL_TABLET | Freq: Every day | ORAL | 1 refills | Status: DC
  Filled 2023-08-20: qty 30, 30d supply, fill #0
  Filled 2023-08-20: qty 90, 90d supply, fill #0
  Filled 2023-08-21: qty 30, 30d supply, fill #0

## 2023-08-20 MED ORDER — TAMSULOSIN HCL 0.4 MG PO CAPS
0.4000 mg | ORAL_CAPSULE | Freq: Every day | ORAL | 0 refills | Status: DC
  Filled 2023-08-20 (×2): qty 30, 30d supply, fill #0

## 2023-08-20 MED ORDER — BENAZEPRIL HCL 40 MG PO TABS
40.0000 mg | ORAL_TABLET | Freq: Every day | ORAL | 0 refills | Status: DC
  Filled 2023-08-20 (×2): qty 30, 30d supply, fill #0

## 2023-08-21 ENCOUNTER — Other Ambulatory Visit: Payer: Self-pay

## 2023-08-22 ENCOUNTER — Other Ambulatory Visit: Payer: Self-pay

## 2023-08-26 ENCOUNTER — Other Ambulatory Visit (HOSPITAL_COMMUNITY): Payer: Self-pay

## 2023-08-26 ENCOUNTER — Other Ambulatory Visit: Payer: Self-pay

## 2023-08-26 ENCOUNTER — Encounter: Payer: Self-pay | Admitting: Family Medicine

## 2023-08-26 ENCOUNTER — Ambulatory Visit (INDEPENDENT_AMBULATORY_CARE_PROVIDER_SITE_OTHER): Payer: Self-pay | Admitting: Family Medicine

## 2023-08-26 ENCOUNTER — Ambulatory Visit: Payer: Self-pay | Admitting: Family Medicine

## 2023-08-26 VITALS — BP 134/78 | HR 87 | Temp 98.0°F | Resp 16 | Ht 68.0 in | Wt 208.0 lb

## 2023-08-26 DIAGNOSIS — Z7984 Long term (current) use of oral hypoglycemic drugs: Secondary | ICD-10-CM

## 2023-08-26 DIAGNOSIS — E118 Type 2 diabetes mellitus with unspecified complications: Secondary | ICD-10-CM | POA: Diagnosis not present

## 2023-08-26 DIAGNOSIS — E782 Mixed hyperlipidemia: Secondary | ICD-10-CM

## 2023-08-26 DIAGNOSIS — I10 Essential (primary) hypertension: Secondary | ICD-10-CM | POA: Diagnosis not present

## 2023-08-26 DIAGNOSIS — Z7985 Long-term (current) use of injectable non-insulin antidiabetic drugs: Secondary | ICD-10-CM

## 2023-08-26 LAB — LIPID PANEL
Cholesterol: 144 mg/dL (ref 0–200)
HDL: 44.3 mg/dL (ref >39.00)
LDL Cholesterol: 81 mg/dL (ref 0–99)
NonHDL: 99.47
Total CHOL/HDL Ratio: 3
Triglycerides: 93 mg/dL (ref 0.0–149.0)
VLDL: 18.6 mg/dL (ref 0.0–40.0)

## 2023-08-26 LAB — COMPREHENSIVE METABOLIC PANEL WITH GFR
ALT: 18 U/L (ref 0–53)
AST: 18 U/L (ref 0–37)
Albumin: 4.8 g/dL (ref 3.5–5.2)
Alkaline Phosphatase: 67 U/L (ref 39–117)
BUN: 12 mg/dL (ref 6–23)
CO2: 28 meq/L (ref 19–32)
Calcium: 10.3 mg/dL (ref 8.4–10.5)
Chloride: 101 meq/L (ref 96–112)
Creatinine, Ser: 1.32 mg/dL (ref 0.40–1.50)
GFR: 53.77 mL/min — ABNORMAL LOW (ref >60.00)
Glucose, Bld: 140 mg/dL — ABNORMAL HIGH (ref 70–99)
Potassium: 4.3 meq/L (ref 3.5–5.1)
Sodium: 136 meq/L (ref 135–145)
Total Bilirubin: 0.9 mg/dL (ref 0.2–1.2)
Total Protein: 7.3 g/dL (ref 6.0–8.3)

## 2023-08-26 LAB — HEMOGLOBIN A1C: Hgb A1c MFr Bld: 7.4 % — ABNORMAL HIGH (ref 4.6–6.5)

## 2023-08-26 LAB — MICROALBUMIN / CREATININE URINE RATIO
Creatinine,U: 11.3 mg/dL
Microalb Creat Ratio: 116 mg/g — ABNORMAL HIGH (ref 0.0–30.0)
Microalb, Ur: 1.3 mg/dL (ref 0.0–1.9)

## 2023-08-26 MED ORDER — OZEMPIC (0.25 OR 0.5 MG/DOSE) 2 MG/3ML ~~LOC~~ SOPN
0.5000 mg | PEN_INJECTOR | SUBCUTANEOUS | 2 refills | Status: DC
  Filled 2023-08-26: qty 3, 28d supply, fill #0

## 2023-08-26 MED ORDER — ROSUVASTATIN CALCIUM 10 MG PO TABS
10.0000 mg | ORAL_TABLET | Freq: Every evening | ORAL | 3 refills | Status: AC
  Filled 2023-08-26 – 2023-09-10 (×2): qty 90, 90d supply, fill #0
  Filled 2023-09-17 – 2023-09-19 (×2): qty 30, 30d supply, fill #0
  Filled 2023-10-16: qty 30, 30d supply, fill #1
  Filled 2023-11-12 (×2): qty 30, 30d supply, fill #2
  Filled 2023-12-05: qty 30, 30d supply, fill #3
  Filled 2023-12-30: qty 30, 30d supply, fill #4
  Filled 2024-02-10: qty 30, 30d supply, fill #5
  Filled 2024-03-11: qty 30, 30d supply, fill #6

## 2023-08-26 MED ORDER — TAMSULOSIN HCL 0.4 MG PO CAPS
0.4000 mg | ORAL_CAPSULE | Freq: Every day | ORAL | 2 refills | Status: AC
  Filled 2023-08-26 – 2023-09-10 (×2): qty 90, 90d supply, fill #0
  Filled 2023-09-17 – 2023-09-19 (×2): qty 30, 30d supply, fill #0
  Filled 2023-10-16: qty 30, 30d supply, fill #1
  Filled 2023-11-12 (×2): qty 30, 30d supply, fill #2
  Filled 2023-12-05: qty 30, 30d supply, fill #3
  Filled 2023-12-30: qty 30, 30d supply, fill #4
  Filled 2024-02-10: qty 30, 30d supply, fill #5
  Filled 2024-03-11: qty 30, 30d supply, fill #6

## 2023-08-26 MED ORDER — FUROSEMIDE 20 MG PO TABS
20.0000 mg | ORAL_TABLET | Freq: Every day | ORAL | 2 refills | Status: AC
  Filled 2023-08-26: qty 90, 90d supply, fill #0
  Filled 2023-08-27: qty 30, 30d supply, fill #0
  Filled 2023-09-10 – 2023-09-19 (×3): qty 30, 30d supply, fill #1
  Filled 2023-10-16: qty 30, 30d supply, fill #2
  Filled 2023-11-12 (×2): qty 30, 30d supply, fill #3
  Filled 2023-12-05: qty 30, 30d supply, fill #4
  Filled 2023-12-30: qty 30, 30d supply, fill #5
  Filled 2024-02-10: qty 30, 30d supply, fill #6
  Filled 2024-03-11: qty 30, 30d supply, fill #7

## 2023-08-26 MED ORDER — SEMAGLUTIDE (1 MG/DOSE) 4 MG/3ML ~~LOC~~ SOPN
1.0000 mg | PEN_INJECTOR | SUBCUTANEOUS | 3 refills | Status: AC
  Filled 2023-08-26 – 2023-09-20 (×3): qty 3, 28d supply, fill #0
  Filled 2023-10-16: qty 3, 28d supply, fill #1
  Filled 2023-11-14: qty 3, 28d supply, fill #2
  Filled 2023-12-05: qty 3, 28d supply, fill #3

## 2023-08-26 NOTE — Patient Instructions (Addendum)
 Give [place] 2-3 business days to get the results of your labs back.   Keep the diet clean and stay active.  Please consider adding some weight resistance exercise to your routine. Consider yoga as well.   Let us know if you need anything.

## 2023-08-26 NOTE — Progress Notes (Signed)
 Subjective:   Chief Complaint  Patient presents with   Follow-up    Follow Up    Bryce Hurst is a 73 y.o. male here for follow-up of diabetes.   Bryce Hurst's self monitored glucose range is mid 100's.  Patient denies hypoglycemic reactions. Patient does not require insulin.   Medications include: Jardiance  25 mg daily, Ozempic  0.5 mg weekly Diet is healthy.  Exercise: walking, swimming  Hypertension Patient presents for hypertension follow up. He does monitor home blood pressures. Blood pressures ranging on average from 130's/80's. He is compliant with medications-benazepril  40 mg daily, Norvasc  5 mg daily. Patient has these side effects of medication: none Diet/exercise as above.   No chest pain or shortness of breath.  Mixed Hyperlipidemia Patient presents for mixed hyperlipidemia follow up. Currently being treated with Crestor  10 mg daily and compliance with treatment thus far has been good. He denies myalgias. Diet/exercise as above The patient is not known to have coexisting coronary artery disease.  Past Medical History:  Diagnosis Date   Acute meniscal tear of knee    right   Cataract of both eyes    Diabetes mellitus without complication (HCC)    Hyperlipidemia    Hypertension      Related testing: Retinal exam: Done Pneumovax: done  Objective:  BP 134/78 (BP Location: Left Arm, Patient Position: Sitting)   Pulse 87   Temp 98 F (36.7 C) (Oral)   Resp 16   Ht 5' 8 (1.727 m)   Wt 208 lb (94.3 kg)   SpO2 96%   BMI 31.63 kg/m  General:  Well developed, well nourished, in no apparent distress Skin:  Warm, no pallor or diaphoresis Head:  Normocephalic, atraumatic Eyes:  Pupils equal and round, sclera anicteric without injection  Lungs:  CTAB, no access msc use Cardio:  RRR, no bruits, no LE edema Musculoskeletal:  Symmetrical muscle groups noted without atrophy or deformity Neuro:  Sensation intact to pinprick on feet Psych: Age appropriate judgment  and insight  Assessment:   Type 2 diabetes mellitus with complication, without long-term current use of insulin (HCC) - Plan: Microalbumin / creatinine urine ratio, Lipid panel, Hemoglobin A1c, Comprehensive metabolic panel with GFR  Essential hypertension  Mixed hyperlipidemia   Plan:   Chronic, stable.  Continue Ozempic  0.5 mg weekly, Jardiance  25 mg daily.  Counseled on diet and exercise. Chronic, stable.  Continue benazepril  40 mg daily, amlodipine  5 mg daily. Chronic, stable.  Continue Crestor  10 mg daily. F/u in 6 mo. The patient voiced understanding and agreement to the plan.  Bryce Mt Plum[place], DO 08/26/23 10:45 AM

## 2023-08-27 ENCOUNTER — Other Ambulatory Visit: Payer: Self-pay

## 2023-08-27 ENCOUNTER — Other Ambulatory Visit (HOSPITAL_COMMUNITY): Payer: Self-pay

## 2023-08-27 ENCOUNTER — Other Ambulatory Visit: Payer: Self-pay | Admitting: Family Medicine

## 2023-08-27 MED ORDER — BENAZEPRIL HCL 40 MG PO TABS
40.0000 mg | ORAL_TABLET | Freq: Every day | ORAL | 1 refills | Status: DC
  Filled 2023-08-27 – 2023-09-10 (×2): qty 90, 90d supply, fill #0
  Filled 2023-09-17 – 2023-09-19 (×2): qty 30, 30d supply, fill #0
  Filled 2023-10-16: qty 30, 30d supply, fill #1
  Filled 2023-11-12 (×2): qty 30, 30d supply, fill #2
  Filled 2023-12-05: qty 30, 30d supply, fill #3
  Filled 2023-12-30: qty 30, 30d supply, fill #4
  Filled 2024-02-10: qty 30, 30d supply, fill #5

## 2023-08-27 MED ORDER — AMLODIPINE BESYLATE 5 MG PO TABS
5.0000 mg | ORAL_TABLET | Freq: Every evening | ORAL | 1 refills | Status: DC
  Filled 2023-08-27 – 2023-09-10 (×2): qty 90, 90d supply, fill #0
  Filled 2023-09-17 – 2023-09-19 (×2): qty 30, 30d supply, fill #0
  Filled 2023-10-16: qty 30, 30d supply, fill #1
  Filled 2023-11-12 (×2): qty 30, 30d supply, fill #2
  Filled 2023-12-05: qty 30, 30d supply, fill #3
  Filled 2023-12-30: qty 30, 30d supply, fill #4
  Filled 2024-02-10: qty 30, 30d supply, fill #5

## 2023-08-28 ENCOUNTER — Other Ambulatory Visit (HOSPITAL_COMMUNITY): Payer: Self-pay

## 2023-08-30 ENCOUNTER — Encounter: Payer: Self-pay | Admitting: Family Medicine

## 2023-09-02 ENCOUNTER — Other Ambulatory Visit: Payer: Self-pay

## 2023-09-02 ENCOUNTER — Telehealth: Payer: Self-pay

## 2023-09-02 ENCOUNTER — Other Ambulatory Visit (HOSPITAL_COMMUNITY): Payer: Self-pay

## 2023-09-02 MED ORDER — FREESTYLE LIBRE 3 PLUS SENSOR MISC
3 refills | Status: DC
  Filled 2023-09-02: qty 2, 30d supply, fill #0
  Filled 2023-09-20 – 2023-09-25 (×2): qty 2, 30d supply, fill #1
  Filled 2023-11-06: qty 2, 30d supply, fill #2
  Filled 2023-12-04: qty 2, 30d supply, fill #3

## 2023-09-02 NOTE — Telephone Encounter (Signed)
 Rx sent.

## 2023-09-02 NOTE — Telephone Encounter (Signed)
 Pharmacy Patient Advocate Encounter   Received notification from Patient Advice Request messages that prior authorization for Freestyle Libre 3 Plus sensors and reader is required/requested.   Insurance verification completed.   The patient is insured through [employer] ADVANTAGE/RX ADVANCE .   Per test claim: The current 30 day co-pay is, $0.  No PA needed at this time. This test claim was processed through [facility]- copay amounts may vary at other pharmacies due to pharmacy/plan contracts, or as the patient moves through the different stages of their insurance plan.     Please send over new scripts for the sensors and reader for the patient.

## 2023-09-02 NOTE — Telephone Encounter (Signed)
 Copied from CRM [id number]. Topic: Clinical - Medication Question >> Sep 02, 2023 11:03 AM Chasity T wrote: Reason for CRM: Patient is calling in because the ozempic  that he received was .50 and has realized that Dr Frann has changed it to 1. He is wanting to know if he needs to just take the .50 that he received for now and then when he runs out to starting taking the 1 or start taking it now.   He also states that is insurance will pay for the freestyle libra monitor as well. Please contact patient to discuss medication.

## 2023-09-03 ENCOUNTER — Telehealth: Payer: Self-pay

## 2023-09-03 ENCOUNTER — Other Ambulatory Visit (HOSPITAL_COMMUNITY): Payer: Self-pay

## 2023-09-03 ENCOUNTER — Other Ambulatory Visit: Payer: Self-pay

## 2023-09-03 NOTE — Telephone Encounter (Signed)
 done

## 2023-09-03 NOTE — Telephone Encounter (Signed)
 Called pt was Advised, stated understand.

## 2023-09-04 ENCOUNTER — Other Ambulatory Visit (HOSPITAL_COMMUNITY): Payer: Self-pay

## 2023-09-04 ENCOUNTER — Other Ambulatory Visit: Payer: Self-pay

## 2023-09-05 ENCOUNTER — Other Ambulatory Visit: Payer: Self-pay

## 2023-09-10 ENCOUNTER — Other Ambulatory Visit: Payer: Self-pay

## 2023-09-10 ENCOUNTER — Other Ambulatory Visit (HOSPITAL_COMMUNITY): Payer: Self-pay

## 2023-09-10 MED FILL — Empagliflozin Tab 25 MG: ORAL | 30 days supply | Qty: 30 | Fill #1 | Status: CN

## 2023-09-10 NOTE — Procedures (Signed)
Result scanned to media

## 2023-09-11 ENCOUNTER — Other Ambulatory Visit: Payer: Self-pay

## 2023-09-16 ENCOUNTER — Other Ambulatory Visit (HOSPITAL_COMMUNITY): Payer: Self-pay

## 2023-09-16 ENCOUNTER — Other Ambulatory Visit: Payer: Self-pay

## 2023-09-17 ENCOUNTER — Other Ambulatory Visit: Payer: Self-pay

## 2023-09-17 ENCOUNTER — Other Ambulatory Visit (HOSPITAL_COMMUNITY): Payer: Self-pay

## 2023-09-17 MED FILL — Empagliflozin Tab 25 MG: ORAL | 30 days supply | Qty: 30 | Fill #1 | Status: CN

## 2023-09-18 ENCOUNTER — Other Ambulatory Visit: Payer: Self-pay

## 2023-09-19 ENCOUNTER — Other Ambulatory Visit (HOSPITAL_COMMUNITY): Payer: Self-pay

## 2023-09-19 ENCOUNTER — Other Ambulatory Visit: Payer: Self-pay

## 2023-09-19 MED FILL — Empagliflozin Tab 25 MG: ORAL | 30 days supply | Qty: 30 | Fill #1 | Status: AC

## 2023-09-20 ENCOUNTER — Other Ambulatory Visit: Payer: Self-pay

## 2023-09-20 ENCOUNTER — Other Ambulatory Visit (HOSPITAL_COMMUNITY): Payer: Self-pay

## 2023-09-23 ENCOUNTER — Other Ambulatory Visit: Payer: Self-pay

## 2023-10-16 ENCOUNTER — Other Ambulatory Visit: Payer: Self-pay

## 2023-10-16 MED FILL — Empagliflozin Tab 25 MG: ORAL | 30 days supply | Qty: 30 | Fill #2 | Status: AC

## 2023-10-17 ENCOUNTER — Other Ambulatory Visit: Payer: Self-pay

## 2023-10-18 ENCOUNTER — Other Ambulatory Visit: Payer: Self-pay

## 2023-11-03 ENCOUNTER — Encounter: Payer: Self-pay | Admitting: Family Medicine

## 2023-11-05 ENCOUNTER — Ambulatory Visit: Admitting: Student

## 2023-11-05 ENCOUNTER — Ambulatory Visit: Payer: Self-pay | Admitting: *Deleted

## 2023-11-05 ENCOUNTER — Encounter: Payer: Self-pay | Admitting: Student

## 2023-11-05 ENCOUNTER — Other Ambulatory Visit (HOSPITAL_BASED_OUTPATIENT_CLINIC_OR_DEPARTMENT_OTHER): Payer: Self-pay

## 2023-11-05 VITALS — BP 111/74 | HR 88 | Ht 68.0 in | Wt 210.0 lb

## 2023-11-05 DIAGNOSIS — M79604 Pain in right leg: Secondary | ICD-10-CM | POA: Diagnosis not present

## 2023-11-05 DIAGNOSIS — R252 Cramp and spasm: Secondary | ICD-10-CM | POA: Diagnosis not present

## 2023-11-05 DIAGNOSIS — M79605 Pain in left leg: Secondary | ICD-10-CM

## 2023-11-05 MED ORDER — CYCLOBENZAPRINE HCL 5 MG PO TABS
5.0000 mg | ORAL_TABLET | Freq: Every day | ORAL | 0 refills | Status: AC
  Filled 2023-11-05: qty 20, 10d supply, fill #0

## 2023-11-05 NOTE — Patient Instructions (Signed)
   Here's an exercise that stretches your thigh muscles. If you've had hip or back surgery, talk with your doctor before trying this stretch. Stand behind a sturdy chair with your feet shoulder-width apart and your knees straight, but not locked. Hold on to the chair for balance with your right hand. Bend your left leg back and grab your foot in your left hand. Keep your knee pointed to the floor. If you can't grab your ankle, loop a resistance band, belt, or towel around your foot and hold both ends. Gently pull your leg until you feel a stretch in your thigh. Hold position for 10 to 30 seconds. Repeat at least 3 to 5 times. Repeat at least 3 to 5 times with your right leg. Reproduced from: [employer]. [employer] at the [employer]. Available at: http://go4life.niapublications.org (Accessed January 13, 2010).

## 2023-11-05 NOTE — Telephone Encounter (Signed)
 FYI Only or Action Required?: FYI only for provider.  Patient was last seen in primary care on 08/26/2023 by Frann Mabel Mt, DO.  Called Nurse Triage reporting Leg Swelling.  Symptoms began several months ago.  Interventions attempted: Nothing.  Symptoms are: gradually worsening.  Triage Disposition: See HCP Within 4 Hours (Or PCP Triage)  Patient/caregiver understands and will follow disposition?: Yes    Reason for Disposition  [1] Thigh, calf, or ankle swelling AND [2] bilateral AND [3] 1 side is more swollen  Answer Assessment - Initial Assessment Questions 1. ONSET: When did the swelling start? (e.g., minutes, hours, days)     June/July- patient had done some running- thought he had irritated muscles in legs. Now in pain. 2. LOCATION: What part of the leg is swollen?  Are both legs swollen or just one leg?     Bilateral leg- upper thigh area 3. SEVERITY: How bad is the swelling? (e.g., localized; mild, moderate, severe)     Moderate- feels like a knot- sensitive- started in left leg, now R leg pain 4. REDNESS: Is there redness or signs of infection?     no 5. PAIN: Is the swelling painful to touch? If Yes, ask: How painful is it?   (Scale 1-10; mild, moderate or severe)     8/10 6. FEVER: Do you have a fever? If Yes, ask: What is it, how was it measured, and when did it start?      no 7. CAUSE: What do you think is causing the leg swelling?     Patient thinks he has injured the leg 8. MEDICAL HISTORY: Do you have a history of blood clots (e.g., DVT), cancer, heart failure, kidney disease, or liver failure?     hypertension 9. RECURRENT SYMPTOM: Have you had leg swelling before? If Yes, ask: When was the last time? What happened that time?     no 10. OTHER SYMPTOMS: Do you have any other symptoms? (e.g., chest pain, difficulty breathing)       no  Protocols used: Leg Swelling and Edema-A-AH    Copied from CRM #[id number]. Topic:  Clinical - Red Word Triage >> Nov 05, 2023  9:18 AM Dawna HERO wrote: Red Word that prompted transfer to Nurse Triage: pt states he has increased swelling in both legs since last month ,messaged provider on MyChart & nurse told him to call and make appt, decision tree stated to triage him , pt stated he is in a lot of pain that's affecting his walking and is getting worse

## 2023-11-05 NOTE — Progress Notes (Unsigned)
 Acute Office Visit  Subjective:     Patient ID: Bryce Hurst, male    DOB: 1950/08/28, 73 y.o.   MRN: [id number]  Chief Complaint  Patient presents with   Edema    HPI  History of Present Illness Bryce Hurst is a 73 year old male who presents with leg swelling and pain after initiating running. He is accompanied by his wife.  In late August, he experienced swelling and pain in his legs, with more swelling in the left leg and pain in the right leg. These symptoms began after a sudden increase in physical activity, specifically running without prior conditioning. The pain is throbbing, limits his ability to walk, and disrupts his sleep. There is no bruising, but constant pain and muscle spasms are present. He uses Tylenol  approximately every six hours and applies Voltaren gel for pain management. He is not engaging in physical activity due to the pain. He typically walks for exercise and occasionally swims, but this pain has restricted him greatly.  Patient denies fever, chills, SOB, CP, palpitations, dyspnea, edema, HA, vision changes, N/V/D, abdominal pain, urinary symptoms, rash, weight changes, and recent illness or hospitalizations.   ROS See HPI     Objective:    BP 111/74   Pulse 88   Ht 5' 8 (1.727 m)   Wt 210 lb (95.3 kg)   SpO2 95%   BMI 31.93 kg/m  {Vitals History (Optional):23777}  Physical Exam  General: No acute distress. Awake and conversant.  Eyes: Normal conjunctiva, anicteric. Round symmetric pupils.  Respiratory: CTAB. Respirations are non-labored. No wheezing.  Skin: Warm. No rashes or ulcers.  Psych: Alert and oriented. Cooperative, Appropriate mood and affect, Normal judgment.  CV: RRR. No murmur. No lower extremity edema.  MSK: Normal ambulation. No clubbing or cyanosis. No edema or swelling in B/L LEs. Neuro:  CN II-XII grossly normal.    No results found for any visits on 11/05/23.      Assessment & Plan:   Problem List Items  Addressed This Visit     Leg cramping - Primary   Relevant Medications   cyclobenzaprine  (FLEXERIL ) 5 MG tablet   Other Relevant Orders   Magnesium   Vitamin B12   Vitamin D  (25 hydroxy)   Comp Met (CMET)   Other Visit Diagnoses       Bilateral leg pain          Pain likely due to muscle overuse, not conditioning properly. No significant swelling or signs of serious conditions. Consider vitamin deficiencies. - Rx- Flexeril  for nighttime use, 1-2 pills as needed for muscle spasms. - Advised Tylenol  [id number] mg every 6 hours, max 3000 mg/day. - Recommended light stretching and heat pads. - Continue Voltaren gel. - Ordered labs for magnesium, B12, and vitamin D , CMP - Advised against alcohol with muscle relaxers. - Encouraged swimming and elliptical for low-impact exercise. - Advised leg elevation. - Discussed potential physical therapy if symptoms persist.  Recommend conservative management with rest, ice/heat as tolerated, and avoidance of activities that worsen pain. Encourage gentle stretching and gradual return to activity once symptoms improve. Heat and massage may help once swelling subsides. Gentle stretching, progressive range-of-motion exercises.May use acetaminophen  for pain relief q 4-6 hours as needed. Advise close monitoring, and to seek care urgently if pain suddenly worsens, swelling increases, numbess/ tingling, redness or fever develops, or if there are symptoms such as chest pain, shortness of breath, or inability to bear weight.  Meds ordered this encounter  Medications   cyclobenzaprine  (FLEXERIL ) 5 MG tablet    Sig: Take 1 tablet (5 mg total) by mouth at bedtime. (Take 1-2 tabs as needed for sleep before bedtime for muscle spasms)    Dispense:  20 tablet    Refill:  0    Supervising Provider:   DOMENICA BLACKBIRD A [[id number]]    No follow-ups on file.  Bryce Mark L Concepcion Kirkpatrick, NP

## 2023-11-06 ENCOUNTER — Other Ambulatory Visit (HOSPITAL_COMMUNITY): Payer: Self-pay

## 2023-11-06 ENCOUNTER — Other Ambulatory Visit: Payer: Self-pay

## 2023-11-06 ENCOUNTER — Ambulatory Visit: Payer: Self-pay | Admitting: Student

## 2023-11-06 LAB — COMPREHENSIVE METABOLIC PANEL WITH GFR
ALT: 23 U/L (ref 0–53)
AST: 15 U/L (ref 0–37)
Albumin: 4.6 g/dL (ref 3.5–5.2)
Alkaline Phosphatase: 60 U/L (ref 39–117)
BUN: 29 mg/dL — ABNORMAL HIGH (ref 6–23)
CO2: 29 meq/L (ref 19–32)
Calcium: 10.5 mg/dL (ref 8.4–10.5)
Chloride: 101 meq/L (ref 96–112)
Creatinine, Ser: 1.54 mg/dL — ABNORMAL HIGH (ref 0.40–1.50)
GFR: 44.62 mL/min — ABNORMAL LOW (ref >60.00)
Glucose, Bld: 115 mg/dL — ABNORMAL HIGH (ref 70–99)
Potassium: 4.2 meq/L (ref 3.5–5.1)
Sodium: 138 meq/L (ref 135–145)
Total Bilirubin: 0.5 mg/dL (ref 0.2–1.2)
Total Protein: 6.9 g/dL (ref 6.0–8.3)

## 2023-11-06 LAB — VITAMIN B12: Vitamin B-12: 431 pg/mL (ref 211–911)

## 2023-11-06 LAB — MAGNESIUM: Magnesium: 2.3 mg/dL (ref 1.5–2.5)

## 2023-11-06 LAB — VITAMIN D 25 HYDROXY (VIT D DEFICIENCY, FRACTURES): VITD: 26.68 ng/mL — ABNORMAL LOW (ref 30.00–100.00)

## 2023-11-12 ENCOUNTER — Other Ambulatory Visit: Payer: Self-pay

## 2023-11-12 MED FILL — Empagliflozin Tab 25 MG: ORAL | 30 days supply | Qty: 30 | Fill #3 | Status: CN

## 2023-11-12 MED FILL — Empagliflozin Tab 25 MG: ORAL | 30 days supply | Qty: 30 | Fill #3 | Status: AC

## 2023-11-13 ENCOUNTER — Other Ambulatory Visit: Payer: Self-pay

## 2023-11-14 ENCOUNTER — Other Ambulatory Visit: Payer: Self-pay

## 2023-11-15 ENCOUNTER — Other Ambulatory Visit: Payer: Self-pay

## 2023-11-26 ENCOUNTER — Encounter: Payer: Self-pay | Admitting: Family Medicine

## 2023-11-26 ENCOUNTER — Ambulatory Visit: Admitting: Family Medicine

## 2023-11-26 ENCOUNTER — Ambulatory Visit: Payer: Self-pay | Admitting: Family Medicine

## 2023-11-26 VITALS — BP 124/78 | HR 97 | Temp 98.0°F | Resp 16 | Ht 68.0 in | Wt 209.8 lb

## 2023-11-26 DIAGNOSIS — Z7984 Long term (current) use of oral hypoglycemic drugs: Secondary | ICD-10-CM | POA: Diagnosis not present

## 2023-11-26 DIAGNOSIS — R809 Proteinuria, unspecified: Secondary | ICD-10-CM | POA: Diagnosis not present

## 2023-11-26 DIAGNOSIS — Z7985 Long-term (current) use of injectable non-insulin antidiabetic drugs: Secondary | ICD-10-CM

## 2023-11-26 DIAGNOSIS — E118 Type 2 diabetes mellitus with unspecified complications: Secondary | ICD-10-CM | POA: Diagnosis not present

## 2023-11-26 DIAGNOSIS — Z23 Encounter for immunization: Secondary | ICD-10-CM | POA: Diagnosis not present

## 2023-11-26 LAB — HEMOGLOBIN A1C: Hgb A1c MFr Bld: 7 % — ABNORMAL HIGH (ref 4.6–6.5)

## 2023-11-26 LAB — MICROALBUMIN / CREATININE URINE RATIO
Creatinine,U: 20.9 mg/dL
Microalb Creat Ratio: UNDETERMINED mg/g (ref 0.0–30.0)
Microalb, Ur: <0.7 mg/dL

## 2023-11-26 NOTE — Patient Instructions (Addendum)
 Give us  2-3 business days to get the results of your labs back.   Keep the diet clean and stay active.  Take Metamucil or Benefiber daily as needed- stay hydrated.  Let us  know if you need anything.

## 2023-11-26 NOTE — Addendum Note (Signed)
 Addended by: DORLENE CHIQUITA RAMAN on: 11/26/2023 10:49 AM   Modules accepted: Orders

## 2023-11-26 NOTE — Progress Notes (Signed)
 Subjective:   Chief Complaint  Patient presents with   Diabetes    Diabetes Check     Bryce Hurst is a 73 y.o. male here for follow-up of diabetes.   Bryce Hurst's self monitored glucose range is low to mid 100's.  Patient denies hypoglycemic reactions. He has a CGM.  Patient does not require insulin.   Medications include: Jardiance  25 mg daily, Ozempic  1 mg weekly Diet is good.  Exercise: lifting wts, walking No CP or SOB.   Patient had proteinuria at his last visit as well.  Past Medical History:  Diagnosis Date   Acute meniscal tear of knee    right   Cataract of both eyes    Diabetes mellitus without complication (HCC)    Hyperlipidemia    Hypertension      Related testing: Retinal exam: Done Pneumovax: done  Objective:  BP 124/78 (BP Location: Left Arm, Patient Position: Sitting)   Pulse 97   Temp 98 F (36.7 C) (Oral)   Resp 16   Ht 5' 8 (1.727 m)   Wt 209 lb 12.8 oz (95.2 kg)   SpO2 97%   BMI 31.90 kg/m  General:  Well developed, well nourished, in no apparent distress Lungs:  CTAB, no access msc use Cardio:  RRR, no bruits, no LE edema Psych: Age appropriate judgment and insight  Assessment:   Type 2 diabetes mellitus with complication, without long-term current use of insulin (HCC) - Plan: Hemoglobin A1c  Proteinuria, unspecified type - Plan: Microalbumin / creatinine urine ratio   Plan:   Chronic, not currently controlled.  Continue Jardiance  25 mg daily, Ozempic  1 mg weekly.  If still not controlled, will increase Ozempic  to 2 mg weekly.  Counseled on diet and exercise. If repeat also shows protein, will have him do a 3rd test in 6 weeks and consider finerenone if still present. He is on an ACEi and SGLT-2 inhibitor.  Flu shot today. F/u in 3-6 mo. The patient voiced understanding and agreement to the plan.  Mabel Mt [place], DO 11/26/23 10:41 AM

## 2023-12-04 ENCOUNTER — Other Ambulatory Visit: Payer: Self-pay

## 2023-12-05 ENCOUNTER — Other Ambulatory Visit: Payer: Self-pay

## 2023-12-05 ENCOUNTER — Other Ambulatory Visit (HOSPITAL_COMMUNITY): Payer: Self-pay

## 2023-12-05 MED FILL — Empagliflozin Tab 25 MG: ORAL | 30 days supply | Qty: 30 | Fill #4 | Status: AC

## 2023-12-09 ENCOUNTER — Other Ambulatory Visit: Payer: Self-pay

## 2023-12-11 ENCOUNTER — Ambulatory Visit

## 2023-12-11 ENCOUNTER — Other Ambulatory Visit: Payer: Self-pay

## 2023-12-11 ENCOUNTER — Telehealth: Payer: Self-pay | Admitting: *Deleted

## 2023-12-11 NOTE — Telephone Encounter (Signed)
 Pt scheduled for in person AWV per Medicare A/B. Left message that I had a cancellation at 1pm today if he is able to make that time and to call and r/s.

## 2023-12-30 ENCOUNTER — Other Ambulatory Visit: Payer: Self-pay

## 2023-12-30 MED FILL — Empagliflozin Tab 25 MG: ORAL | 30 days supply | Qty: 30 | Fill #5 | Status: AC

## 2023-12-31 ENCOUNTER — Other Ambulatory Visit (HOSPITAL_COMMUNITY): Payer: Self-pay

## 2023-12-31 ENCOUNTER — Other Ambulatory Visit: Payer: Self-pay

## 2023-12-31 ENCOUNTER — Ambulatory Visit

## 2023-12-31 VITALS — BP 128/62 | HR 76 | Temp 98.7°F | Ht 68.0 in | Wt 207.8 lb

## 2023-12-31 DIAGNOSIS — Z0000 Encounter for general adult medical examination without abnormal findings: Secondary | ICD-10-CM | POA: Diagnosis not present

## 2023-12-31 NOTE — Patient Instructions (Addendum)
 Mr. Linnemann,  Thank you for taking the time for your Medicare Wellness Visit. I appreciate your continued commitment to your health goals. Please review the care plan we discussed, and feel free to reach out if I can assist you further.  Please note that Annual Wellness Visits do not include a physical exam. Some assessments may be limited, especially if the visit was conducted virtually. If needed, we may recommend an in-person follow-up with your provider.  Ongoing Care Seeing your primary care provider every 3 to 6 months helps us  monitor your health and provide consistent, personalized care.    Referrals If a referral was made during today's visit and you haven't received any updates within two weeks, please contact the referred provider directly to check on the status.  Recommended Screenings:  Health Maintenance  Topic Date Due   COVID-19 Vaccine (5 - 2025-26 season) 11/04/2024*   Eye exam for diabetics  04/23/2024   Hemoglobin A1C  05/26/2024   Complete foot exam   08/25/2024   Yearly kidney function blood test for diabetes  11/04/2024   Yearly kidney health urinalysis for diabetes  11/25/2024   Medicare Annual Wellness Visit  12/30/2024   Colon Cancer Screening  04/11/2026   DTaP/Tdap/Td vaccine (4 - Td or Tdap) 08/13/2026   Pneumococcal Vaccine for age over 16  Completed   Flu Shot  Completed   Hepatitis C Screening  Completed   Zoster (Shingles) Vaccine  Completed   Meningitis B Vaccine  Aged Out  *Topic was postponed. The date shown is not the original due date.       12/31/2023    1:32 PM  Advanced Directives  Does Patient Have a Medical Advance Directive? Yes  Type of Estate Agent of [place];Living will  Does patient want to make changes to medical advance directive? No - Patient declined  Copy of Healthcare Power of Attorney in Chart? No - copy requested    Vision: Annual vision screenings are recommended for early detection of glaucoma,  cataracts, and diabetic retinopathy. These exams can also reveal signs of chronic conditions such as diabetes and high blood pressure.  Dental: Annual dental screenings help detect early signs of oral cancer, gum disease, and other conditions linked to overall health, including heart disease and diabetes.  Please see the attached documents for additional preventive care recommendations.

## 2023-12-31 NOTE — Progress Notes (Signed)
 Chief Complaint  Patient presents with   Medicare Wellness     Subjective:   Bryce Hurst is a 73 y.o. male who presents for a Medicare Annual Wellness Visit.  Allergies (verified) Patient has no known allergies.   History: Past Medical History:  Diagnosis Date   Acute meniscal tear of knee    right   Cataract of both eyes    Diabetes mellitus without complication (HCC)    Hyperlipidemia    Hypertension    Past Surgical History:  Procedure Laterality Date   CATARACT EXTRACTION, BILATERAL     CATARACT EXTRACTION, BILATERAL Bilateral 05/27/2012   COLONOSCOPY W/ POLYPECTOMY  1996 & 2014   negative 2003 (was due 2006-2008)   KNEE ARTHROSCOPY WITH MEDIAL MENISECTOMY Right 04/25/2012   ct   MOUTH SURGERY     maxillary bone graft  ([facility]GE[facility] OFFICE)   ORTHOPEDIC SURGERY Right 05/10/2012   Family History  Problem Relation Age of Onset   Diabetes Mother    Hypertension Mother    Heart disease Mother        MI X4 , initially @ 55   Stroke Mother 58   Cancer Father        colon & lung   Colon cancer Father    Congestive Heart Failure Father        ? CM   Hypertension Sister    Pulmonary embolism Sister    Transient ischemic attack Sister 42   Hypertension Brother    Colon cancer Brother        lung mets   Colon cancer Brother    Cancer Maternal Aunt    Breast cancer Maternal Aunt    Breast cancer Paternal Aunt    Prostate cancer Paternal Uncle    Esophageal cancer Neg Hx    Stomach cancer Neg Hx    Rectal cancer Neg Hx    Colon polyps Neg Hx    Social History   Occupational History   Not on file  Tobacco Use   Smoking status: Never   Smokeless tobacco: Never  Vaping Use   Vaping status: Never Used  Substance and Sexual Activity   Alcohol use: No   Drug use: No   Sexual activity: Not Currently   Tobacco Counseling Counseling given: No  SDOH Screenings   Food Insecurity: No Food Insecurity (12/31/2023)  Housing: Unknown (12/31/2023)   Transportation Needs: No Transportation Needs (12/31/2023)  Utilities: Not At Risk (12/31/2023)  Depression (PHQ2-9): Low Risk  (12/31/2023)  Financial Resource Strain: Low Risk  (12/31/2023)  Physical Activity: Inactive (12/31/2023)  Social Connections: Socially Integrated (12/31/2023)  Stress: No Stress Concern Present (12/31/2023)  Tobacco Use: Low Risk  (12/31/2023)  Health Literacy: Adequate Health Literacy (12/31/2023)   See flowsheets for full screening details  Depression Screen PHQ 2 & 9 Depression Scale- Over the past 2 weeks, how often have you been bothered by any of the following problems? Little interest or pleasure in doing things: 0 Feeling down, depressed, or hopeless (PHQ Adolescent also includes...irritable): 0 PHQ-2 Total Score: 0     Goals Addressed               This Visit's Progress     Increase physical activity (pt-stated)        Remain active.       Visit info / Clinical Intake: Medicare Wellness Visit Type:: Initial Annual Wellness Visit Persons participating in visit:: patient Medicare Wellness Visit Mode:: In-person (required for [facility]) Information  given by:: patient Interpreter Needed?: No Pre-visit prep was completed: yes AWV questionnaire completed by patient prior to visit?: yes Date:: 12/31/23 Living arrangements:: lives with spouse/significant other Patient's Overall Health Status Rating: good Typical amount of pain: none Does pain affect daily life?: no Are you currently prescribed opioids?: no  Dietary Habits and Nutritional Risks How many meals a day?: 3 Eats fruit and vegetables daily?: yes Most meals are obtained by: preparing own meals In the last 2 weeks, have you had any of the following?: none Diabetic:: (!) yes Any non-healing wounds?: no How often do you check your BS?: continuous glucose monitor Would you like to be referred to a Nutritionist or for Diabetic Management? : no  Functional Status Activities of  Daily Living (to include ambulation/medication): Independent Ambulation: Independent with device- listed below Home Assistive Devices/Equipment: Eyeglasses Medication Administration: Independent Home Management: Independent Manage your own finances?: yes Primary transportation is: driving Concerns about vision?: no *vision screening is required for WTM* Concerns about hearing?: no  Fall Screening Falls in the past year?: 0 Number of falls in past year: 0 Was there an injury with Fall?: 0 Fall Risk Category Calculator: 0 Patient Fall Risk Level: Low Fall Risk  Fall Risk Patient at Risk for Falls Due to: No Fall Risks Fall risk Follow up: Falls evaluation completed  Home and Transportation Safety: All rugs have non-skid backing?: N/A, no rugs All stairs or steps have railings?: yes Grab bars in the bathtub or shower?: yes Have non-skid surface in bathtub or shower?: yes Good home lighting?: yes Regular seat belt use?: yes Hospital stays in the last year:: no  Cognitive Assessment Difficulty concentrating, remembering, or making decisions? : no Will 6CIT or Mini Cog be Completed: no 6CIT or Mini Cog Declined: patient alert, oriented, able to answer questions appropriately and recall recent events  Advance Directives (For Healthcare) Does Patient Have a Medical Advance Directive?: Yes Does patient want to make changes to medical advance directive?: No - Patient declined Type of Advance Directive: Healthcare Power of [place]; Living will Copy of Healthcare Power of Attorney in Chart?: No - copy requested Copy of Living Will in Chart?: No - copy requested  Reviewed/Updated  Reviewed/Updated: Reviewed All (Medical, Surgical, Family, Medications, Allergies, Care Teams, Patient Goals)        Objective:    Today's Vitals   12/31/23 1323  BP: 128/62  Pulse: 76  Temp: 98.7 F (37.1 C)  TempSrc: Oral  SpO2: 96%  Weight: 207 lb 12.8 oz (94.3 kg)  Height: 5' 8 (1.727 m)    Body mass index is 31.6 kg/m.  Current Medications (verified) Outpatient Encounter Medications as of 12/31/2023  Medication Sig   amLODipine  (NORVASC ) 5 MG tablet Take 1 tablet (5 mg total) by mouth at bedtime.   benazepril  (LOTENSIN ) 40 MG tablet Take 1 tablet (40 mg total) by mouth daily.   Continuous Glucose Sensor (FREESTYLE LIBRE 3 PLUS SENSOR) MISC Change sensor every 15 days.   cyclobenzaprine  (FLEXERIL ) 5 MG tablet Take 1-2 tablets by mouth before bedtime as needed for muscle spasms   fluticasone (FLONASE) 50 MCG/ACT nasal spray Spray 1 spray every day by intranasal route.   furosemide  (LASIX ) 20 MG tablet Take 1 tablet (20 mg total) by mouth daily.   JARDIANCE  25 MG TABS tablet TAKE 1 TABLET BY MOUTH DAILY   rosuvastatin  (CRESTOR ) 10 MG tablet Take 1 tablet (10 mg total) by mouth at bedtime.   Semaglutide , 1 MG/DOSE, 4 MG/3ML SOPN Inject  1 mg as directed once a week.   tamsulosin  (FLOMAX ) 0.4 MG CAPS capsule Take 1 capsule (0.4 mg total) by mouth daily after supper.   No facility-administered encounter medications on file as of 12/31/2023.   Hearing/Vision screen Hearing Screening - Comments:: Denies hearing difficulties   Vision Screening - Comments:: Wears rx glasses - up to date with routine eye exams with  Dr Newt Immunizations and Health Maintenance Health Maintenance  Topic Date Due   COVID-19 Vaccine (5 - 2025-26 season) 11/04/2024 (Originally 10/14/2023)   OPHTHALMOLOGY EXAM  04/23/2024   HEMOGLOBIN A1C  05/26/2024   FOOT EXAM  08/25/2024   Diabetic kidney evaluation - eGFR measurement  11/04/2024   Diabetic kidney evaluation - Urine ACR  11/25/2024   Medicare Annual Wellness (AWV)  12/30/2024   Colonoscopy  04/11/2026   DTaP/Tdap/Td (4 - Td or Tdap) 08/13/2026   Pneumococcal Vaccine: 50+ Years  Completed   Influenza Vaccine  Completed   Hepatitis C Screening  Completed   Zoster Vaccines- Shingrix  Completed   Meningococcal B Vaccine  Aged Out         Assessment/Plan:  This is a routine wellness examination for Eulis.  Patient Care Team: Frann Mabel Mt, DO as PCP - General (Family Medicine)  I have personally reviewed and noted the following in the patient's chart:   Medical and social history Use of alcohol, tobacco or illicit drugs  Current medications and supplements including opioid prescriptions. Functional ability and status Nutritional status Physical activity Advanced directives List of other physicians Hospitalizations, surgeries, and ER visits in previous 12 months Vitals Screenings to include cognitive, depression, and falls Referrals and appointments  No orders of the defined types were placed in this encounter.  In addition, I have reviewed and discussed with patient certain preventive protocols, quality metrics, and best practice recommendations. A written personalized care plan for preventive services as well as general preventive health recommendations were provided to patient.   Rojelio LELON Blush, LPN   [id number]/81/[id number]   Return in 1 year on 01/05/25  After Visit Summary: (In Person-Declined) Patient declined AVS at this time.  Nurse Notes: None

## 2024-01-01 ENCOUNTER — Other Ambulatory Visit: Payer: Self-pay

## 2024-01-02 ENCOUNTER — Other Ambulatory Visit: Payer: Self-pay

## 2024-01-06 ENCOUNTER — Other Ambulatory Visit: Payer: Self-pay

## 2024-01-07 ENCOUNTER — Other Ambulatory Visit: Payer: Self-pay

## 2024-01-23 ENCOUNTER — Telehealth: Payer: Self-pay | Admitting: Family Medicine

## 2024-01-23 ENCOUNTER — Other Ambulatory Visit: Payer: Self-pay

## 2024-01-23 ENCOUNTER — Other Ambulatory Visit (HOSPITAL_COMMUNITY): Payer: Self-pay

## 2024-01-23 ENCOUNTER — Other Ambulatory Visit: Payer: Self-pay | Admitting: Family Medicine

## 2024-01-23 MED ORDER — FREESTYLE LIBRE 3 PLUS SENSOR MISC
3 refills | Status: AC
  Filled 2024-01-23: qty 2, 30d supply, fill #0
  Filled 2024-03-12: qty 2, 30d supply, fill #1

## 2024-01-23 NOTE — Telephone Encounter (Signed)
 Rx sent.

## 2024-01-23 NOTE — Telephone Encounter (Signed)
 Copied from CRM #[id number]. Topic: Clinical - Medication Refill >> Jan 23, 2024 12:18 PM Viola F wrote: Medication: Continuous Glucose Sensor (FREESTYLE LIBRE 3 PLUS SENSOR) MISC [[id number]]  Has the patient contacted their pharmacy? Yes (Agent: If no, request that the patient contact the pharmacy for the refill. If patient does not wish to contact the pharmacy document the reason why and proceed with request.) (Agent: If yes, when and what did the pharmacy advise?)  This is the patient's preferred pharmacy:   [place] [facility] [address] [place] [address] Phone: [phone] Fax: [phone]    Is this the correct pharmacy for this prescription? Yes If no, delete pharmacy and type the correct one.   Has the prescription been filled recently? Yes  Is the patient out of the medication? Yes  Has the patient been seen for an appointment in the last year OR does the patient have an upcoming appointment? Yes  Can we respond through MyChart? Yes  Agent: Please be advised that Rx refills may take up to 3 business days. We ask that you follow-up with your pharmacy.

## 2024-02-10 ENCOUNTER — Other Ambulatory Visit: Payer: Self-pay

## 2024-02-10 MED FILL — Empagliflozin Tab 25 MG: ORAL | 30 days supply | Qty: 30 | Fill #6 | Status: AC

## 2024-02-11 ENCOUNTER — Other Ambulatory Visit: Payer: Self-pay

## 2024-02-12 ENCOUNTER — Other Ambulatory Visit: Payer: Self-pay

## 2024-03-11 ENCOUNTER — Other Ambulatory Visit: Payer: Self-pay

## 2024-03-11 ENCOUNTER — Other Ambulatory Visit: Payer: Self-pay | Admitting: Family Medicine

## 2024-03-11 MED FILL — Empagliflozin Tab 25 MG: ORAL | 30 days supply | Qty: 30 | Fill #7 | Status: AC

## 2024-03-12 ENCOUNTER — Other Ambulatory Visit (HOSPITAL_COMMUNITY): Payer: Self-pay

## 2024-03-12 ENCOUNTER — Other Ambulatory Visit: Payer: Self-pay

## 2024-03-12 MED ORDER — AMLODIPINE BESYLATE 5 MG PO TABS
5.0000 mg | ORAL_TABLET | Freq: Every evening | ORAL | 1 refills | Status: AC
  Filled 2024-03-12: qty 30, 30d supply, fill #0

## 2024-03-12 MED ORDER — BENAZEPRIL HCL 40 MG PO TABS
40.0000 mg | ORAL_TABLET | Freq: Every day | ORAL | 1 refills | Status: AC
  Filled 2024-03-12: qty 30, 30d supply, fill #0

## 2024-03-13 ENCOUNTER — Other Ambulatory Visit: Payer: Self-pay

## 2024-06-01 ENCOUNTER — Ambulatory Visit: Admitting: Family Medicine

## 2025-01-05 ENCOUNTER — Ambulatory Visit
